# Patient Record
Sex: Female | Born: 1995 | Hispanic: No | Marital: Married | Smoking: Former smoker
Health system: Southern US, Community
[De-identification: ages and names within clinical notes are randomized; demographics above are authoritative.]

## PROBLEM LIST (undated history)

## (undated) DIAGNOSIS — G43909 Migraine, unspecified, not intractable, without status migrainosus: Secondary | ICD-10-CM

## (undated) DIAGNOSIS — J302 Other seasonal allergic rhinitis: Secondary | ICD-10-CM

## (undated) DIAGNOSIS — F419 Anxiety disorder, unspecified: Secondary | ICD-10-CM

## (undated) DIAGNOSIS — E119 Type 2 diabetes mellitus without complications: Secondary | ICD-10-CM

## (undated) DIAGNOSIS — K056 Periodontal disease, unspecified: Secondary | ICD-10-CM

## (undated) HISTORY — DX: Periodontal disease, unspecified: K05.6

## (undated) HISTORY — DX: Migraine, unspecified, not intractable, without status migrainosus: G43.909

## (undated) HISTORY — PX: OTHER SURGICAL HISTORY: SHX169

## (undated) HISTORY — PX: KNEE SURGERY: SHX244

## (undated) HISTORY — DX: Type 2 diabetes mellitus without complications: E11.9

## (undated) HISTORY — DX: Anxiety disorder, unspecified: F41.9

## (undated) HISTORY — PX: WISDOM TOOTH EXTRACTION: SHX21

## (undated) HISTORY — PX: NASAL POLYP EXCISION: SHX2068

## (undated) HISTORY — DX: Other seasonal allergic rhinitis: J30.2

---

## 2020-04-04 LAB — NOVEL CORONAVIRUS, NAA: SARS-CoV-2, NAA: POSITIVE

## 2021-03-18 ENCOUNTER — Encounter: Payer: Self-pay | Admitting: Obstetrics and Gynecology

## 2021-03-27 ENCOUNTER — Ambulatory Visit (INDEPENDENT_AMBULATORY_CARE_PROVIDER_SITE_OTHER): Payer: BC Managed Care – PPO | Admitting: Obstetrics

## 2021-03-27 ENCOUNTER — Encounter: Payer: Self-pay | Admitting: Obstetrics

## 2021-03-27 ENCOUNTER — Other Ambulatory Visit: Payer: Self-pay

## 2021-03-27 VITALS — BP 130/80 | Ht 65.0 in | Wt 130.0 lb

## 2021-03-27 DIAGNOSIS — R350 Frequency of micturition: Secondary | ICD-10-CM | POA: Diagnosis not present

## 2021-03-27 DIAGNOSIS — G43909 Migraine, unspecified, not intractable, without status migrainosus: Secondary | ICD-10-CM | POA: Insufficient documentation

## 2021-03-27 DIAGNOSIS — F419 Anxiety disorder, unspecified: Secondary | ICD-10-CM | POA: Diagnosis not present

## 2021-03-27 DIAGNOSIS — Z113 Encounter for screening for infections with a predominantly sexual mode of transmission: Secondary | ICD-10-CM

## 2021-03-27 DIAGNOSIS — Z23 Encounter for immunization: Secondary | ICD-10-CM

## 2021-03-27 DIAGNOSIS — Z124 Encounter for screening for malignant neoplasm of cervix: Secondary | ICD-10-CM | POA: Diagnosis not present

## 2021-03-27 DIAGNOSIS — G43809 Other migraine, not intractable, without status migrainosus: Secondary | ICD-10-CM
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
## 2021-03-28 ENCOUNTER — Other Ambulatory Visit (HOSPITAL_COMMUNITY)
Admission: RE | Admit: 2021-03-28 | Discharge: 2021-03-28 | Disposition: A | Discharge status: Home / Self Care | Payer: BC Managed Care – PPO | Source: Ambulatory Visit | Attending: Obstetrics and Gynecology | Admitting: Obstetrics and Gynecology

## 2021-03-28 DIAGNOSIS — Z113 Encounter for screening for infections with a predominantly sexual mode of transmission: Secondary | ICD-10-CM | POA: Insufficient documentation

## 2021-03-28 DIAGNOSIS — Z124 Encounter for screening for malignant neoplasm of cervix: Secondary | ICD-10-CM | POA: Insufficient documentation

## 2021-03-28 LAB — HEPATITIS C ANTIBODY: Hep C Virus Ab: 0.2 s/co ratio (ref 0.0–0.9)

## 2021-03-28 LAB — HIV ANTIBODY (ROUTINE TESTING W REFLEX): HIV Screen 4th Generation wRfx: NONREACTIVE

## 2021-03-28 LAB — HEPATITIS B SURFACE ANTIGEN: Hepatitis B Surface Ag: NEGATIVE

## 2021-03-28 LAB — RPR: RPR Ser Ql: NONREACTIVE

## 2021-03-28 NOTE — Addendum Note (Signed)
Addended by: Tommie Raymond on: 03/28/2021 11:27 AM   Modules accepted: Orders

## 2021-03-29 LAB — CERVICOVAGINAL ANCILLARY ONLY
Chlamydia: NEGATIVE
Comment: NEGATIVE
Comment: NORMAL
Neisseria Gonorrhea: NEGATIVE

## 2021-03-29 LAB — CYTOLOGY - PAP: Diagnosis: NEGATIVE

## 2021-03-30 LAB — URINE CULTURE: Organism ID, Bacteria: NO GROWTH

## 2021-04-04 ENCOUNTER — Encounter: Payer: Self-pay | Admitting: Obstetrics

## 2021-05-24 ENCOUNTER — Encounter: Payer: Self-pay | Admitting: Obstetrics

## 2021-05-24 ENCOUNTER — Other Ambulatory Visit (HOSPITAL_COMMUNITY)
Admission: RE | Admit: 2021-05-24 | Discharge: 2021-05-24 | Disposition: A | Discharge status: Home / Self Care | Payer: BC Managed Care – PPO | Source: Ambulatory Visit | Attending: Obstetrics | Admitting: Obstetrics

## 2021-05-24 ENCOUNTER — Other Ambulatory Visit: Payer: Self-pay

## 2021-05-24 ENCOUNTER — Ambulatory Visit (INDEPENDENT_AMBULATORY_CARE_PROVIDER_SITE_OTHER): Payer: BC Managed Care – PPO | Admitting: Obstetrics

## 2021-05-24 VITALS — BP 118/80 | HR 89 | Ht 65.0 in | Wt 124.3 lb

## 2021-05-24 DIAGNOSIS — B3731 Acute candidiasis of vulva and vagina: Secondary | ICD-10-CM | POA: Insufficient documentation

## 2021-05-24 DIAGNOSIS — N898 Other specified noninflammatory disorders of vagina: Secondary | ICD-10-CM | POA: Insufficient documentation

## 2021-05-24 DIAGNOSIS — R350 Frequency of micturition: Secondary | ICD-10-CM | POA: Insufficient documentation

## 2021-05-24 DIAGNOSIS — R631 Polydipsia: Secondary | ICD-10-CM | POA: Insufficient documentation

## 2021-05-24 DIAGNOSIS — N3941 Urge incontinence: Secondary | ICD-10-CM

## 2021-05-24 MED ORDER — FLUCONAZOLE 150 MG PO TABS: 150.0000 mg | ORAL_TABLET | Freq: Once | ORAL | 1 refills | Status: AC

## 2021-05-24 NOTE — Progress Notes (Signed)
SUBJECTIVE  HPI  Stacy George is a 26 y.o.-year-old female who presents for continued vaginal and vulvar itching today. She was seen in December for an annual visit and had vaginal itching and irritation without discharge at that time. Mild vaginal irritation was noted on exam at that time and she was negative for STIs. She has tried removing all potential irritants, using topical Monistat, apple cider vinegar baths, and other means to stop the itching, but it has not improved. She also reports a lump or knot in her vagina that seems to come and go. It is not always noticeable but seems like a cyst that might be draining. She is also concerned about frequent urination. She is waking up nightly and having to run to the bathroom and is sometimes incontinent. She also reports excessive thirst.  Medical/Surgical History Past Medical History:  Diagnosis Date   Anxiety    Migraines    Periodontal disease    Seasonal allergies    Past Surgical History:  Procedure Laterality Date   KNEE SURGERY     cyst and fluid removed from behind knee cap   NASAL POLYP EXCISION     periodontal surgery     WISDOM TOOTH EXTRACTION       OB History     Gravida  0   Para  0   Term  0   Preterm  0   AB  0   Living  0      SAB  0   IAB  0   Ectopic  0   Multiple  0   Live Births  0             Current Medications No outpatient medications prior to visit.   No facility-administered medications prior to visit.     ROS Negative except as noted in HPI  No flowsheet data found.   OBJECTIVE  Last Weight  Most recent update: 05/24/2021  1:59 PM    Weight  56.4 kg (124 lb 4.8 oz)             Body mass index is 20.68 kg/m.    BP 118/80    Pulse 89    Ht 5\' 5"  (1.651 m)    Wt 124 lb 4.8 oz (56.4 kg)    LMP 04/19/2021 (Approximate)    BMI 20.68 kg/m   General appearance: alert, cooperative, and appears stated age Pelvic: White, adherent discharge noted on lower segment  of labia and on clitoral hood. Vulvar skin appears red and irritated.  Unable to palpate or visualize nodule described by Patty. Mild bladder prolapse noted. Dermatologic: Shiny red irritated skin noted in thigh creases  ASSESSMENT  1) Vaginal itching and irritation, likely yeast overgrowth 2) Excessive thirst and urination 3) Urge incontinence, potential mild bladder prolapse  PLAN 1) Swab collected. Treat empirically with Diflucan. Refer to MD for further assessment if no improvement. 2) Hemoglobin A1C 3) Referral to pelvic PT   04/21/2021, CNM

## 2021-05-27 ENCOUNTER — Other Ambulatory Visit: Payer: BC Managed Care – PPO

## 2021-05-28 ENCOUNTER — Other Ambulatory Visit: Payer: Self-pay

## 2021-05-28 ENCOUNTER — Encounter: Payer: Self-pay | Admitting: Obstetrics

## 2021-05-28 ENCOUNTER — Other Ambulatory Visit: Payer: BC Managed Care – PPO

## 2021-05-28 DIAGNOSIS — N3941 Urge incontinence: Secondary | ICD-10-CM

## 2021-05-28 LAB — CERVICOVAGINAL ANCILLARY ONLY
Bacterial Vaginitis (gardnerella): NEGATIVE
Candida Glabrata: NEGATIVE
Candida Vaginitis: POSITIVE — AB
Comment: NEGATIVE
Comment: NEGATIVE
Comment: NEGATIVE
Comment: NEGATIVE
Trichomonas: NEGATIVE

## 2021-05-29 ENCOUNTER — Other Ambulatory Visit: Payer: Self-pay | Admitting: Obstetrics

## 2021-05-29 DIAGNOSIS — R7309 Other abnormal glucose: Secondary | ICD-10-CM

## 2021-05-29 LAB — HEMOGLOBIN A1C
Est. average glucose Bld gHb Est-mCnc: 358 mg/dL
Hgb A1c MFr Bld: 14.1 % — ABNORMAL HIGH (ref 4.8–5.6)

## 2021-05-29 NOTE — Progress Notes (Signed)
Spoke to [place] on phone about lab results. Urgent consult to internal med. Schedule lab-only appointment here for CMP, beta-hyroxybutyrate, and microalbumin in the meantime. Briefly discussed diabetic diet. Shaolin verbalizes understanding and agreement with plan. ? ?M. Chryl Heck, CNM ?

## 2021-06-03 ENCOUNTER — Other Ambulatory Visit: Payer: Self-pay | Admitting: Obstetrics

## 2021-06-03 ENCOUNTER — Encounter: Payer: Self-pay | Admitting: Emergency Medicine

## 2021-06-03 ENCOUNTER — Other Ambulatory Visit: Payer: BC Managed Care – PPO

## 2021-06-03 ENCOUNTER — Emergency Department
Admission: EM | Admit: 2021-06-03 | Discharge: 2021-06-03 | Disposition: A | Discharge status: Home / Self Care | Payer: BC Managed Care – PPO | Source: Home / Self Care | Attending: Emergency Medicine | Admitting: Emergency Medicine

## 2021-06-03 ENCOUNTER — Other Ambulatory Visit: Payer: Self-pay

## 2021-06-03 DIAGNOSIS — K2970 Gastritis, unspecified, without bleeding: Secondary | ICD-10-CM | POA: Insufficient documentation

## 2021-06-03 DIAGNOSIS — N3090 Cystitis, unspecified without hematuria: Secondary | ICD-10-CM | POA: Insufficient documentation

## 2021-06-03 DIAGNOSIS — Z7189 Other specified counseling: Secondary | ICD-10-CM

## 2021-06-03 DIAGNOSIS — D72829 Elevated white blood cell count, unspecified: Secondary | ICD-10-CM | POA: Insufficient documentation

## 2021-06-03 DIAGNOSIS — R739 Hyperglycemia, unspecified: Secondary | ICD-10-CM

## 2021-06-03 DIAGNOSIS — E1165 Type 2 diabetes mellitus with hyperglycemia: Secondary | ICD-10-CM | POA: Insufficient documentation

## 2021-06-03 DIAGNOSIS — R7309 Other abnormal glucose: Secondary | ICD-10-CM

## 2021-06-03 DIAGNOSIS — E1110 Type 2 diabetes mellitus with ketoacidosis without coma: Secondary | ICD-10-CM | POA: Diagnosis not present

## 2021-06-03 DIAGNOSIS — E1010 Type 1 diabetes mellitus with ketoacidosis without coma: Secondary | ICD-10-CM | POA: Diagnosis not present

## 2021-06-03 LAB — CBC
HCT: 40.9 % (ref 36.0–46.0)
Hemoglobin: 13.4 g/dL (ref 12.0–15.0)
MCH: 28.6 pg (ref 26.0–34.0)
MCHC: 32.8 g/dL (ref 30.0–36.0)
MCV: 87.4 fL (ref 80.0–100.0)
Platelets: 336 10*3/uL (ref 150–400)
RBC: 4.68 MIL/uL (ref 3.87–5.11)
RDW: 11.9 % (ref 11.5–15.5)
WBC: 12 10*3/uL — ABNORMAL HIGH (ref 4.0–10.5)
nRBC: 0 % (ref 0.0–0.2)

## 2021-06-03 LAB — BASIC METABOLIC PANEL
Anion gap: 7 (ref 5–15)
BUN: 14 mg/dL (ref 6–20)
CO2: 22 mmol/L (ref 22–32)
Calcium: 8.8 mg/dL — ABNORMAL LOW (ref 8.9–10.3)
Chloride: 103 mmol/L (ref 98–111)
Creatinine, Ser: 0.57 mg/dL (ref 0.44–1.00)
GFR, Estimated: >60 mL/min (ref >60)
Glucose, Bld: 264 mg/dL — ABNORMAL HIGH (ref 70–99)
Potassium: 3.7 mmol/L (ref 3.5–5.1)
Sodium: 132 mmol/L — ABNORMAL LOW (ref 135–145)

## 2021-06-03 LAB — URINALYSIS, COMPLETE (UACMP) WITH MICROSCOPIC
Bilirubin Urine: NEGATIVE
Glucose, UA: >=500 mg/dL — AB
Ketones, ur: 80 mg/dL — AB
Nitrite: NEGATIVE
Protein, ur: 30 mg/dL — AB
Specific Gravity, Urine: 1.038 — ABNORMAL HIGH (ref 1.005–1.030)
pH: 5 (ref 5.0–8.0)

## 2021-06-03 LAB — LIPASE, BLOOD: Lipase: 33 U/L (ref 11–51)

## 2021-06-03 LAB — HEPATIC FUNCTION PANEL
ALT: 11 U/L (ref 0–44)
AST: 13 U/L — ABNORMAL LOW (ref 15–41)
Albumin: 4.4 g/dL (ref 3.5–5.0)
Alkaline Phosphatase: 71 U/L (ref 38–126)
Bilirubin, Direct: <0.1 mg/dL (ref 0.0–0.2)
Total Bilirubin: 0.8 mg/dL (ref 0.3–1.2)
Total Protein: 7.3 g/dL (ref 6.5–8.1)

## 2021-06-03 LAB — POC URINE PREG, ED: Preg Test, Ur: NEGATIVE

## 2021-06-03 MED ORDER — FLUCONAZOLE 50 MG PO TABS
150.0000 mg | ORAL_TABLET | Freq: Once | ORAL | Status: AC
  Administered 2021-06-03: 150 mg via ORAL
  Filled 2021-06-03: qty 1

## 2021-06-03 MED ORDER — PANTOPRAZOLE SODIUM 40 MG PO TBEC: 40.0000 mg | DELAYED_RELEASE_TABLET | Freq: Every day | ORAL | 0 refills | Status: DC

## 2021-06-03 MED ORDER — METFORMIN HCL 500 MG PO TABS: 1000.0000 mg | ORAL_TABLET | Freq: Two times a day (BID) | ORAL | 0 refills | Status: DC

## 2021-06-03 MED ORDER — CEPHALEXIN 500 MG PO CAPS: 500.0000 mg | ORAL_CAPSULE | Freq: Four times a day (QID) | ORAL | 0 refills | Status: DC

## 2021-06-03 NOTE — ED Provider Triage Note (Signed)
Emergency Medicine Provider Triage Evaluation Note ? ?Stacy George , a 26 y.o. female  was evaluated in triage.  Pt complains of presents to the emergency department with dizziness, hyperglycemia and some epigastric abdominal pain.  Patient states that she has not been able to get in with a primary care provider to start diabetes medications despite being diagnosed last week.  She denies chest pain, chest tightness or shortness of breath. ? ?Review of Systems  ?Positive: Patient has hyperglycemia and dizziness. ?Negative: No chest pain. ? ?Physical Exam  ?There were no vitals taken for this visit. ?Gen:   Awake, no distress   ?Resp:  Normal effort  ?MSK:   Moves extremities without difficulty  ?Other:   ? ?Medical Decision Making  ?Medically screening exam initiated at 6:24 PM.  Appropriate orders placed.  Stacy George was informed that the remainder of the evaluation will be completed by another provider, this initial triage assessment does not replace that evaluation, and the importance of remaining in the ED until their evaluation is complete. ? ? ?  ?Stacy [place], PA-C ?06/03/21 1824 ? ?

## 2021-06-03 NOTE — ED Triage Notes (Signed)
Pt reports that last week she was diagnosed with Diabetes last week. She is having trouble finding a doctor to go to. She just moved to the area also. Her A1C was 14.1 and Glucose was 358 last week. When she tested earlier it was 305 she has been unable to get any medications. She has been urinating a lot and very thirsty.   ?

## 2021-06-03 NOTE — ED Provider Notes (Signed)
? ?[facility] ?Provider Note ? ? ? Event Date/Time  ? First MD Initiated Contact with Patient 06/03/21 2120   ?  (approximate) ? ? ?History  ? ?Urinary Frequency and Diabetes ? ? ?HPI ? ?Stacy George is a 26 y.o. female with a past medical history of recently diagnosed diabetes seen on elevated A1c obtained at recent OB visit 2/28 which was initially obtained due to recurrent yeast infections who presents for evaluation of ongoing symptoms which she attributes to this including fatigue, lightheadedness, polyuria, polydipsia some crampy upper abdominal discomfort.  Abdominal discomfort has been going on for 3 to 4 days.  No vomiting, diarrhea, burning with urination although patient does states she has had a little bit of white discharge come back.  No headache, earache, sore throat, vomiting, rash or extremity pain.  No other acute concerns at this time.  No recent EtOH use or illicit drug use other than some intermittent THC.  No other acute concerns at this time. ? ?  ? ? ?Physical Exam  ?Triage Vital Signs: ?ED Triage Vitals [06/03/21 1826]  ?Enc Vitals Group  ?   BP (!) 120/108  ?   Pulse Rate 73  ?   Resp 20  ?   Temp 98.4 ?F (36.9 ?C)  ?   Temp Source Oral  ?   SpO2 100 %  ?   Weight 124 lb (56.2 kg)  ?   Height 5\' 5"  (1.651 m)  ?   Head Circumference   ?   Peak Flow   ?   Pain Score 0  ?   Pain Loc   ?   Pain Edu?   ?   Excl. in GC?   ? ? ?Most recent vital signs: ?Vitals:  ? 06/03/21 1826 06/03/21 2130  ?BP: (!) 120/108 123/84  ?Pulse: 73 74  ?Resp: 20 19  ?Temp: 98.4 ?F (36.9 ?C) 98.4 ?F (36.9 ?C)  ?SpO2: 100% 100%  ? ? ?General: Awake, no distress.  ?CV:  Good peripheral perfusion.  ?Resp:  Normal effort.  ?Abd:  No distention.  Soft throughout although there is some very mild tenderness in left upper quadrant. ?Other:  No CVA tenderness. ? ? ?ED Results / Procedures / Treatments  ?Labs ?(all labs ordered are listed, but only abnormal results are displayed) ?Labs Reviewed  ?BASIC  METABOLIC PANEL - Abnormal; Notable for the following components:  ?    Result Value  ? Sodium 132 (*)   ? Glucose, Bld 264 (*)   ? Calcium 8.8 (*)   ? All other components within normal limits  ?CBC - Abnormal; Notable for the following components:  ? WBC 12.0 (*)   ? All other components within normal limits  ?HEPATIC FUNCTION PANEL - Abnormal; Notable for the following components:  ? AST 13 (*)   ? All other components within normal limits  ?URINALYSIS, COMPLETE (UACMP) WITH MICROSCOPIC - Abnormal; Notable for the following components:  ? Color, Urine YELLOW (*)   ? APPearance HAZY (*)   ? Specific Gravity, Urine 1.038 (*)   ? Glucose, UA >=500 (*)   ? Hgb urine dipstick MODERATE (*)   ? Ketones, ur 80 (*)   ? Protein, ur 30 (*)   ? Leukocytes,Ua SMALL (*)   ? Bacteria, UA RARE (*)   ? All other components within normal limits  ?URINE CULTURE  ?LIPASE, BLOOD  ?POC URINE PREG, ED  ?CBG MONITORING, ED  ?CBG MONITORING, ED  ? ? ? ?  EKG ? ? ? ?RADIOLOGY ? ? ? ?PROCEDURES: ? ?Critical Care performed: No ? ?Procedures ? ? ? ?MEDICATIONS ORDERED IN ED: ?Medications  ?fluconazole (DIFLUCAN) tablet 150 mg (150 mg Oral Given 06/03/21 2144)  ? ? ? ?IMPRESSION / MDM / ASSESSMENT AND PLAN / ED COURSE  ?I reviewed the triage vital signs and the nursing notes. ?             ?               ? ?Patient presents with above-stated history and exam for evaluation of several weeks of progressively worsening fatigue, headedness, and polyuria and polydipsia as well as a couple days of some crampy intermittent left upper quadrant abdominal pain.  She was recently informed she is a diabetic.  She is not currently on medications for this and does not follow-up with PCP.  On arrival she is afebrile and hemodynamically stable. ? ?I suspect her left upper quadrant discomfort is likely secondary some gastritis.  She is denying any abdominal pain is no other tenderness or abnormal vitals to suggest a kidney stone, torsion, appendicitis,  diverticulitis or other immediate life-threatening intra-abdominal process.  No epigastric tenderness or elevation of the lipase to suggest acute pancreatitis.  BMP today confirms hyperglycemia with a glucose of 264 without any other significant electrolyte or metabolic derangements.  No acidosis to suggest DKA.  Patient is not in HHS.  CBC with nonspecific leukocytosis with WBC count of 12,000 without evidence of acute anemia and normal platelets. ? ?Pregnancy test is negative.  Lipase not suggestive of acute pancreatitis.  Hepatic function panel is unremarkable.  Urinalysis is some ketones and glucose as well as moderate hemoglobin and small leukocyte esterase with 11-20 WBCs and some bacteria.  Somewhat difficult to exclude cystitis.  Will cover with course of Keflex and send urine culture. ? ?We will give a one-time dose of fluconazole the patient reports recurrence of her white vaginal discharge that is very itchy and recent diagnosis of vaginal candidiasis.  We will have her follow-up with primary care.  Will start patient on metformin.  At this point do not believe she requires hospitalization and is stable for discharge with outpatient follow-up.  Extensive discussion had regarding pathophysiology of diabetes and importance of taking medications and close follow-up for further management and avoid complications.  Discussed returning for any new or worsening of symptoms.  Discharged in stable condition.  Strict return precautions advised and discussed. ? ?  ? ? ?FINAL CLINICAL IMPRESSION(S) / ED DIAGNOSES  ? ?Final diagnoses:  ?Hyperglycemia  ?Diabetes education, encounter for  ?Gastritis without bleeding, unspecified chronicity, unspecified gastritis type  ?Cystitis  ? ? ? ?Rx / DC Orders  ? ?ED Discharge Orders   ? ?      Ordered  ?  metFORMIN (GLUCOPHAGE) 500 MG tablet  2 times daily with meals       ? 06/03/21 2141  ?  pantoprazole (PROTONIX) 40 MG tablet  Daily       ? 06/03/21 2146  ?  cephALEXin  (KEFLEX) 500 MG capsule  4 times daily       ? 06/03/21 2200  ? ?  ?  ? ?  ? ? ? ?Note:  This document was prepared using Dragon voice recognition software and may include unintentional dictation errors. ?  ?Gilles Chiquito, MD ?06/03/21 2201 ? ?

## 2021-06-03 NOTE — Addendum Note (Signed)
Addended by: Tommie Raymond on: 06/03/2021 03:05 PM ? ? Modules accepted: Orders ? ?

## 2021-06-04 LAB — MICROALBUMIN / CREATININE URINE RATIO
Creatinine, Urine: 53.8 mg/dL
Microalb/Creat Ratio: 25 mg/g creat (ref 0–29)
Microalbumin, Urine: 13.7 ug/mL

## 2021-06-04 LAB — COMPREHENSIVE METABOLIC PANEL
ALT: 8 IU/L (ref 0–32)
AST: 11 IU/L (ref 0–40)
Albumin/Globulin Ratio: 2 (ref 1.2–2.2)
Albumin: 4.7 g/dL (ref 3.9–5.0)
Alkaline Phosphatase: 94 IU/L (ref 44–121)
BUN/Creatinine Ratio: 22 (ref 9–23)
BUN: 12 mg/dL (ref 6–20)
Bilirubin Total: 0.4 mg/dL (ref 0.0–1.2)
CO2: 18 mmol/L — ABNORMAL LOW (ref 20–29)
Calcium: 9.6 mg/dL (ref 8.7–10.2)
Chloride: 101 mmol/L (ref 96–106)
Creatinine, Ser: 0.55 mg/dL — ABNORMAL LOW (ref 0.57–1.00)
Globulin, Total: 2.3 g/dL (ref 1.5–4.5)
Glucose: 272 mg/dL — ABNORMAL HIGH (ref 70–99)
Potassium: 4 mmol/L (ref 3.5–5.2)
Sodium: 136 mmol/L (ref 134–144)
Total Protein: 7 g/dL (ref 6.0–8.5)
eGFR: 132 mL/min/{1.73_m2} (ref >59)

## 2021-06-05 LAB — URINE CULTURE

## 2021-06-06 ENCOUNTER — Other Ambulatory Visit: Payer: Self-pay

## 2021-06-06 ENCOUNTER — Inpatient Hospital Stay: Payer: BC Managed Care – PPO

## 2021-06-06 ENCOUNTER — Inpatient Hospital Stay
Admission: EM | Admit: 2021-06-06 | Discharge: 2021-06-07 | DRG: 638 | Disposition: A | Discharge status: Home / Self Care | Payer: BC Managed Care – PPO | Attending: Internal Medicine | Admitting: Internal Medicine

## 2021-06-06 ENCOUNTER — Encounter: Payer: Self-pay | Admitting: Emergency Medicine

## 2021-06-06 DIAGNOSIS — E872 Acidosis, unspecified: Secondary | ICD-10-CM | POA: Diagnosis present

## 2021-06-06 DIAGNOSIS — E1110 Type 2 diabetes mellitus with ketoacidosis without coma: Secondary | ICD-10-CM | POA: Diagnosis present

## 2021-06-06 DIAGNOSIS — E871 Hypo-osmolality and hyponatremia: Secondary | ICD-10-CM | POA: Diagnosis present

## 2021-06-06 DIAGNOSIS — Z20822 Contact with and (suspected) exposure to covid-19: Secondary | ICD-10-CM | POA: Diagnosis present

## 2021-06-06 DIAGNOSIS — Z7984 Long term (current) use of oral hypoglycemic drugs: Secondary | ICD-10-CM | POA: Diagnosis not present

## 2021-06-06 DIAGNOSIS — E109 Type 1 diabetes mellitus without complications: Secondary | ICD-10-CM | POA: Diagnosis present

## 2021-06-06 DIAGNOSIS — R112 Nausea with vomiting, unspecified: Secondary | ICD-10-CM | POA: Diagnosis not present

## 2021-06-06 DIAGNOSIS — Z79899 Other long term (current) drug therapy: Secondary | ICD-10-CM | POA: Diagnosis not present

## 2021-06-06 DIAGNOSIS — J302 Other seasonal allergic rhinitis: Secondary | ICD-10-CM | POA: Diagnosis present

## 2021-06-06 DIAGNOSIS — K2970 Gastritis, unspecified, without bleeding: Secondary | ICD-10-CM | POA: Diagnosis present

## 2021-06-06 DIAGNOSIS — E1010 Type 1 diabetes mellitus with ketoacidosis without coma: Principal | ICD-10-CM | POA: Diagnosis present

## 2021-06-06 DIAGNOSIS — F17290 Nicotine dependence, other tobacco product, uncomplicated: Secondary | ICD-10-CM | POA: Diagnosis present

## 2021-06-06 DIAGNOSIS — E0800 Diabetes mellitus due to underlying condition with hyperosmolarity without nonketotic hyperglycemic-hyperosmolar coma (NKHHC): Secondary | ICD-10-CM

## 2021-06-06 LAB — CBG MONITORING, ED
Glucose-Capillary: 349 mg/dL — ABNORMAL HIGH (ref 70–99)
Glucose-Capillary: 304 mg/dL — ABNORMAL HIGH (ref 70–99)
Glucose-Capillary: 256 mg/dL — ABNORMAL HIGH (ref 70–99)
Glucose-Capillary: 182 mg/dL — ABNORMAL HIGH (ref 70–99)
Glucose-Capillary: 128 mg/dL — ABNORMAL HIGH (ref 70–99)

## 2021-06-06 LAB — COMPREHENSIVE METABOLIC PANEL
ALT: 10 U/L (ref 0–44)
AST: 14 U/L — ABNORMAL LOW (ref 15–41)
Albumin: 4.4 g/dL (ref 3.5–5.0)
Alkaline Phosphatase: 75 U/L (ref 38–126)
Anion gap: 11 (ref 5–15)
BUN: 10 mg/dL (ref 6–20)
CO2: 21 mmol/L — ABNORMAL LOW (ref 22–32)
Calcium: 9.5 mg/dL (ref 8.9–10.3)
Chloride: 99 mmol/L (ref 98–111)
Creatinine, Ser: 0.52 mg/dL (ref 0.44–1.00)
GFR, Estimated: >60 mL/min (ref >60)
Glucose, Bld: 379 mg/dL — ABNORMAL HIGH (ref 70–99)
Potassium: 3.6 mmol/L (ref 3.5–5.1)
Sodium: 131 mmol/L — ABNORMAL LOW (ref 135–145)
Total Bilirubin: 1.1 mg/dL (ref 0.3–1.2)
Total Protein: 7.4 g/dL (ref 6.5–8.1)

## 2021-06-06 LAB — CBC
HCT: 48.9 % — ABNORMAL HIGH (ref 36.0–46.0)
Hemoglobin: 14.4 g/dL (ref 12.0–15.0)
MCH: 28.8 pg (ref 26.0–34.0)
MCHC: 29.4 g/dL — ABNORMAL LOW (ref 30.0–36.0)
MCV: 97.8 fL (ref 80.0–100.0)
Platelets: 372 10*3/uL (ref 150–400)
RBC: 5 MIL/uL (ref 3.87–5.11)
RDW: 11.5 % (ref 11.5–15.5)
WBC: 10.4 10*3/uL (ref 4.0–10.5)
nRBC: 0 % (ref 0.0–0.2)

## 2021-06-06 LAB — URINALYSIS, ROUTINE W REFLEX MICROSCOPIC
Bacteria, UA: NONE SEEN
Bilirubin Urine: NEGATIVE
Glucose, UA: >=500 mg/dL — AB
Hgb urine dipstick: NEGATIVE
Ketones, ur: 80 mg/dL — AB
Leukocytes,Ua: NEGATIVE
Nitrite: NEGATIVE
Protein, ur: NEGATIVE mg/dL
Specific Gravity, Urine: 1.039 — ABNORMAL HIGH (ref 1.005–1.030)
pH: 6 (ref 5.0–8.0)

## 2021-06-06 LAB — RESP PANEL BY RT-PCR (FLU A&B, COVID) ARPGX2
Influenza A by PCR: NEGATIVE
Influenza B by PCR: NEGATIVE
SARS Coronavirus 2 by RT PCR: NEGATIVE

## 2021-06-06 LAB — BETA-HYDROXYBUTYRIC ACID: Beta-Hydroxybutyric Acid: 1.35 mmol/L — ABNORMAL HIGH (ref 0.05–0.27)

## 2021-06-06 LAB — POC URINE PREG, ED: Preg Test, Ur: NEGATIVE

## 2021-06-06 LAB — LIPASE, BLOOD: Lipase: 33 U/L (ref 11–51)

## 2021-06-06 MED ORDER — ENOXAPARIN SODIUM 40 MG/0.4ML IJ SOSY
40.0000 mg | PREFILLED_SYRINGE | INTRAMUSCULAR | Status: DC
  Administered 2021-06-06: 22:00:00 40 mg via SUBCUTANEOUS
  Filled 2021-06-06: qty 0.4

## 2021-06-06 MED ORDER — SODIUM CHLORIDE 0.9 % IV SOLN
1.0000 g | INTRAVENOUS | Status: DC
  Administered 2021-06-06: 22:00:00 1 g via INTRAVENOUS
  Filled 2021-06-06 (×2): qty 10

## 2021-06-06 MED ORDER — LACTATED RINGERS IV BOLUS
1000.0000 mL | Freq: Once | INTRAVENOUS | Status: AC
  Administered 2021-06-06: 21:00:00 1000 mL via INTRAVENOUS

## 2021-06-06 MED ORDER — DEXTROSE 50 % IV SOLN: 0.0000 mL | INTRAVENOUS | Status: DC | PRN

## 2021-06-06 MED ORDER — LACTATED RINGERS IV SOLN: INTRAVENOUS | Status: DC

## 2021-06-06 MED ORDER — ONDANSETRON HCL 4 MG/2ML IJ SOLN
4.0000 mg | Freq: Once | INTRAMUSCULAR | Status: AC
  Administered 2021-06-06: 19:00:00 4 mg via INTRAVENOUS
  Filled 2021-06-06: qty 2

## 2021-06-06 MED ORDER — DEXTROSE IN LACTATED RINGERS 5 % IV SOLN: INTRAVENOUS | Status: DC

## 2021-06-06 MED ORDER — INSULIN REGULAR(HUMAN) IN NACL 100-0.9 UT/100ML-% IV SOLN
INTRAVENOUS | Status: DC
  Administered 2021-06-06: 21:00:00 3.8 [IU]/h via INTRAVENOUS
  Administered 2021-06-06: 23:00:00 0.3 [IU]/h via INTRAVENOUS
  Administered 2021-06-07: 0.5 [IU]/h via INTRAVENOUS
  Administered 2021-06-07: 0.4 [IU]/h via INTRAVENOUS
  Filled 2021-06-06: qty 100

## 2021-06-06 MED ORDER — LACTATED RINGERS IV BOLUS
1000.0000 mL | Freq: Once | INTRAVENOUS | Status: AC
  Administered 2021-06-06: 19:00:00 1000 mL via INTRAVENOUS

## 2021-06-06 NOTE — H&P (Signed)
History and Physical   George Stacy AUQ:[id number] DOB: 08-30-1995 DOA: 06/06/2021  PCP: Pcp, No  Patient coming from: home  I have personally briefly reviewed patient's old medical records in [facility] EMR.  Chief Concern: Hyperglycemia  HPI: [name] is a 77 with prior diagnosis of diabetes and history of polyuria and polydipsia, presents emergency department from home for chief concerns of hyperglycemia.  Initial vitals in the emergency department showed temperature of 98.1, respiration rate of 18, heart rate 78, blood pressure 122/85, SPO2 of 96% on room air.  Serum sodium was 131, potassium 3.6, chloride 99, bicarb 21, BUN of 10, serum creatinine of 0.52, GFR greater than 60, nonfasting blood glucose 379, WBC 10.4, hemoglobin 14.4, platelets of 372.  Beta hydroxybutyrate was elevated at 1.35.  UA was negative for nitrates leukocytes.  Pregnancy was negative.  Of note her UA on 06/04/2020 showed small leukocytes.  ED treatment: LR 1 L bolus, ondansetron 4 mg.  At bedside, she is able to tell me her name, age, identifiy her finace at bedside.   She woke up vomiting. She check her glucose with otc glucometer and it was 307 and she had saltine crackers and she could not keep anything down, on repeat it was 477 and third 482.   She denies dysuria, hematuria, diarrhea, syncope.   She endorses chest pain, that happens at night. She feels it is difficult to breath at night.  She denies fever, cough. She endorses chills.   Social history: She lives with her fiance. She vapes infrequently. She infrequently drinks etoh and last drink was three weeks. She infrequently smokes thc. She works as a Education officer, environmental at [employer].   Vaccination history: She is vaccinated for covid 19 and influenza.   ROS: Constitutional: no weight change, no fever ENT/Mouth: no sore throat, no rhinorrhea Eyes: no eye pain, no vision changes Cardiovascular: no chest pain, no dyspnea,  no  edema, no palpitations Respiratory: no cough, no sputum, no wheezing Gastrointestinal: no nausea, no vomiting, no diarrhea, no constipation Genitourinary: no urinary incontinence, no dysuria, no hematuria Musculoskeletal: no arthralgias, no myalgias Skin: no skin lesions, no pruritus, Neuro: + weakness, no loss of consciousness, no syncope Psych: no anxiety, no depression, + decrease appetite Heme/Lymph: no bruising, no bleeding  ED Course: Discussed with emergency medicine provider, patient requiring hospitalization for chief concerns of hyperglycemia requiring insulin drip.  Assessment/Plan  Principal Problem:   Intractable nausea and vomiting Active Problems:   Hyperglycemia   Diabetes mellitus (HCC)   Metabolic acidosis   Hyperosmolar hyponatremia    Endocrine Diabetes mellitus (HCC) Assessment & Plan - At this time unclear if it is type I, 1.5, type II - Extensive discussion with patient that she will need close follow-up with PCP - With A1c of 14.1, patient is not a candidate for oral antihyperglycemic medications - Patient will need insulin, glucometer, lancets, glucose strips - Discussed extensively patient that she may be a candidate for continuous glucose monitoring and or insulin pump with good compliance - Advised patient to check her feet and toes daily and to have annual eye exams - She endorses understanding and compliance  - TOC consulted for PCP needs; diabetes coordinator education ordered  Other Hyperosmolar hyponatremia Assessment & Plan - Corrected is 138, [place] 1999  Metabolic acidosis Assessment & Plan - Mild with bicarb of 21 and elevated beta hydroxybutyrate - Her gap is not elevated - Continue insulin GTT as above  Hyperglycemia Assessment & Plan -  Patient clinical presentation is consistent with diabetes type 1 versus diabetes type 1.5 - Her A1c on 05/28/2021 is 14.1 - Patient is not a candidate for oral antiglycemic agents alone given the  A1c above - Patient will need endocrinology referral and/or close follow-up with PCP in the management of diabetes - Patient will need insulin at this time and dosing will need to be adjusted - Admit to stepdown, inpatient, telemetry for insulin GTT  Patient just moved here from [place] and will need PCP referral  Chart reviewed.   DVT prophylaxis: Enoxaparin Code Status: Full code Diet: Carb modified Family Communication: Updated fianc, Greig CastillaAndrew, at bedside Disposition Plan: Pending clinical course Consults called: TOC, diabetes coordinator Admission status: Stepdown, inpatient  Past Medical History:  Diagnosis Date   Anxiety    Migraines    Periodontal disease    Seasonal allergies    Past Surgical History:  Procedure Laterality Date   KNEE SURGERY     cyst and fluid removed from behind knee cap   NASAL POLYP EXCISION     periodontal surgery     WISDOM TOOTH EXTRACTION     Social History:  reports that she has never smoked. She has never used smokeless tobacco. She reports current alcohol use of about 1.0 standard drink per week. She reports current drug use. Drug: Marijuana.  No Known Allergies Family History  Problem Relation Age of Onset   Hypertension Maternal Grandmother    Breast cancer Paternal Grandmother        not sure of age   Family history: Family history reviewed and not pertinent  Prior to Admission medications   Medication Sig Start Date End Date Taking? Authorizing Provider  cephALEXin (KEFLEX) 500 MG capsule Take 1 capsule (500 mg total) by mouth 4 (four) times daily for 5 days. 06/03/21 06/08/21 Yes Gilles ChiquitoSmith, Zachary P, MD  metFORMIN (GLUCOPHAGE) 500 MG tablet Take 2 tablets (1,000 mg total) by mouth 2 (two) times daily with a meal. 06/03/21 07/03/21 Yes Gilles ChiquitoSmith, Zachary P, MD  pantoprazole (PROTONIX) 40 MG tablet Take 1 tablet (40 mg total) by mouth daily. 06/03/21 07/03/21 Yes Gilles ChiquitoSmith, Zachary P, MD   Physical Exam: Vitals:   06/06/21 1631 06/06/21 1635  06/06/21 2231  BP:  122/85 121/87  Pulse:  78 80  Resp:  18 (!) 27  Temp:  98.1 F (36.7 C)   TempSrc:  Oral   SpO2:  96% 98%  Weight: 56.2 kg    Height: 5\' 5"  (1.651 m)     Constitutional: appears age-appropriate, NAD, calm, comfortable Eyes: PERRL, lids and conjunctivae normal ENMT: Mucous membranes are moist. Posterior pharynx clear of any exudate or lesions. Age-appropriate dentition. Hearing appropriate Neck: normal, supple, no masses, no thyromegaly Respiratory: clear to auscultation bilaterally, no wheezing, no crackles. Normal respiratory effort. No accessory muscle use.  Cardiovascular: Regular rate and rhythm, no murmurs / rubs / gallops. No extremity edema. 2+ pedal pulses. No carotid bruits.  Abdomen: no tenderness, no masses palpated, no hepatosplenomegaly. Bowel sounds positive.  Musculoskeletal: no clubbing / cyanosis. No joint deformity upper and lower extremities. Good ROM, no contractures, no atrophy. Normal muscle tone.  Skin: no rashes, lesions, ulcers. No induration Neurologic: Sensation intact. Strength 5/5 in all 4.  Psychiatric: Normal judgment and insight. Alert and oriented x 3. Normal mood.   EKG: Not indicated  Chest x-ray on Admission: I personally reviewed and I agree with radiologist reading as below.  Portable chest x-ray (1 view)  Result Date:  06/06/2021 CLINICAL DATA:  Nausea EXAM: PORTABLE CHEST 1 VIEW COMPARISON:  None. FINDINGS: Cardiac and mediastinal contours are within normal limits. No focal pulmonary opacity. No pleural effusion or pneumothorax. No acute osseous abnormality. IMPRESSION: No acute cardiopulmonary process. Electronically Signed   By: Wiliam Ke M.D.   On: 06/06/2021 19:55    Labs on Admission: I have personally reviewed following labs  CBC: Recent Labs  Lab 06/03/21 1833 06/06/21 1637  WBC 12.0* 10.4  HGB 13.4 14.4  HCT 40.9 48.9*  MCV 87.4 97.8  PLT 336 372   Basic Metabolic Panel: Recent Labs  Lab 06/03/21 1518  06/03/21 1833 06/06/21 1637  NA 136 132* 131*  K 4.0 3.7 3.6  CL 101 103 99  CO2 18* 22 21*  GLUCOSE 272* 264* 379*  BUN 12 14 10   CREATININE 0.55* 0.57 0.52  CALCIUM 9.6 8.8* 9.5   GFR: Estimated Creatinine Clearance: 97 mL/min (by C-G formula based on SCr of 0.52 mg/dL).  Liver Function Tests: Recent Labs  Lab 06/03/21 1518 06/03/21 1833 06/06/21 1637  AST 11 13* 14*  ALT 8 11 10   ALKPHOS 94 71 75  BILITOT 0.4 0.8 1.1  PROT 7.0 7.3 7.4  ALBUMIN 4.7 4.4 4.4   Recent Labs  Lab 06/03/21 1833 06/06/21 1637  LIPASE 33 33   CBG: Recent Labs  Lab 06/06/21 1634 06/06/21 1926 06/06/21 2041 06/06/21 2309  GLUCAP 349* 304* 256* 128*   Urine analysis:    Component Value Date/Time   COLORURINE STRAW (A) 06/06/2021 1637   APPEARANCEUR CLEAR (A) 06/06/2021 1637   LABSPEC 1.039 (H) 06/06/2021 1637   PHURINE 6.0 06/06/2021 1637   GLUCOSEU >=500 (A) 06/06/2021 1637   HGBUR NEGATIVE 06/06/2021 1637   BILIRUBINUR NEGATIVE 06/06/2021 1637   KETONESUR 80 (A) 06/06/2021 1637   PROTEINUR NEGATIVE 06/06/2021 1637   NITRITE NEGATIVE 06/06/2021 1637   LEUKOCYTESUR NEGATIVE 06/06/2021 1637   Dr. 08/06/2021 [facility]  If 7PM-7AM, please contact overnight-coverage provider If 7AM-7PM, please contact day coverage provider [email or web address]  06/06/2021, 11:14 PM

## 2021-06-06 NOTE — ED Triage Notes (Signed)
Pt comes into the ED via POv c/o nausea.  Pt seen on 06/03/21 for the same thing and explains she was recently diagnosed with diabetes.  Pt in NAD at this time with even and unlabored respirations.  ?

## 2021-06-06 NOTE — ED Provider Notes (Signed)
? ?[facility] ?Provider Note ? ? ? Event Date/Time  ? First MD Initiated Contact with Patient 06/06/21 1828   ?  (approximate) ? ? ?History  ? ?Nausea ? ? ?HPI ? ?Stacy George is a 26 y.o. female here with nausea and vomiting.  The patient states that she was just recently diagnosed with diabetes at her OB after she had a positive A1c for recurrent yeast infections.  She states that she has had progressively worsening general fatigue, polyuria, polydipsia, and malaise.  She has been nauseous.  She has been losing weight.  She was prescribed metformin, states she has been taking this without significant relief.  She has, however, had increased loose stools.  Denies any overt fevers or chills.  No other complaints.  No known family history of diabetes. ?  ? ? ?Physical Exam  ? ?Triage Vital Signs: ?ED Triage Vitals  ?Enc Vitals Group  ?   BP 06/06/21 1635 122/85  ?   Pulse Rate 06/06/21 1635 78  ?   Resp 06/06/21 1635 18  ?   Temp 06/06/21 1635 98.1 ?F (36.7 ?C)  ?   Temp Source 06/06/21 1635 Oral  ?   SpO2 06/06/21 1635 96 %  ?   Weight 06/06/21 1631 124 lb (56.2 kg)  ?   Height 06/06/21 1631 5\' 5"  (1.651 m)  ?   Head Circumference --   ?   Peak Flow --   ?   Pain Score 06/06/21 1631 0  ?   Pain Loc --   ?   Pain Edu? --   ?   Excl. in GC? --   ? ? ?Most recent vital signs: ?Vitals:  ? 06/06/21 1635  ?BP: 122/85  ?Pulse: 78  ?Resp: 18  ?Temp: 98.1 ?F (36.7 ?C)  ?SpO2: 96%  ? ? ? ?General: Awake, no distress.  ?CV:  Good peripheral perfusion.  Regular rate and rhythm. ?Resp:  Normal effort.  Slight tachypnea.  Breath sounds clear. ?Abd:  No distention.  No tenderness. ?Other:  Dry mucous membranes. ? ? ?ED Results / Procedures / Treatments  ? ?Labs ?(all labs ordered are listed, but only abnormal results are displayed) ?Labs Reviewed  ?COMPREHENSIVE METABOLIC PANEL - Abnormal; Notable for the following components:  ?    Result Value  ? Sodium 131 (*)   ? CO2 21 (*)   ? Glucose, Bld 379 (*)    ? AST 14 (*)   ? All other components within normal limits  ?CBC - Abnormal; Notable for the following components:  ? HCT 48.9 (*)   ? MCHC 29.4 (*)   ? All other components within normal limits  ?URINALYSIS, ROUTINE W REFLEX MICROSCOPIC - Abnormal; Notable for the following components:  ? Color, Urine STRAW (*)   ? APPearance CLEAR (*)   ? Specific Gravity, Urine 1.039 (*)   ? Glucose, UA >=500 (*)   ? Ketones, ur 80 (*)   ? All other components within normal limits  ?BETA-HYDROXYBUTYRIC ACID - Abnormal; Notable for the following components:  ? Beta-Hydroxybutyric Acid 1.35 (*)   ? All other components within normal limits  ?CBG MONITORING, ED - Abnormal; Notable for the following components:  ? Glucose-Capillary 349 (*)   ? All other components within normal limits  ?CBG MONITORING, ED - Abnormal; Notable for the following components:  ? Glucose-Capillary 304 (*)   ? All other components within normal limits  ?CBG MONITORING, ED - Abnormal; Notable for  the following components:  ? Glucose-Capillary 256 (*)   ? All other components within normal limits  ?RESP PANEL BY RT-PCR (FLU A&B, COVID) ARPGX2  ?LIPASE, BLOOD  ?BASIC METABOLIC PANEL  ?POC URINE PREG, ED  ? ? ? ?EKG ? ? ? ?RADIOLOGY ? ? ? ? ?PROCEDURES: ? ?Critical Care performed: No ? ?.1-3 Lead EKG Interpretation ?Performed by: Shaune PollackIsaacs, Evelena Masci, MD ?Authorized by: Shaune PollackIsaacs, Lafawn Lenoir, MD  ? ?  Interpretation: normal   ?  ECG rate:  70-90 ?  ECG rate assessment: normal   ?  Rhythm: sinus rhythm   ?  Ectopy: none   ?  Conduction: normal   ?Comments:  ?   Indication: weakness ?.Critical Care ?Performed by: Shaune PollackIsaacs, Lilianne Delair, MD ?Authorized by: Shaune PollackIsaacs, Vonnetta Akey, MD  ? ?Critical care provider statement:  ?  Critical care time (minutes):  30 ?  Critical care was necessary to treat or prevent imminent or life-threatening deterioration of the following conditions:  Circulatory failure, cardiac failure and respiratory failure ?  Critical care was time spent personally by me  on the following activities:  Development of treatment plan with patient or surrogate, discussions with consultants, evaluation of patient's response to treatment, examination of patient, ordering and review of laboratory studies, ordering and review of radiographic studies, ordering and performing treatments and interventions, pulse oximetry, re-evaluation of patient's condition and review of old charts ? ? ? ?MEDICATIONS ORDERED IN ED: ?Medications  ?enoxaparin (LOVENOX) injection 40 mg (has no administration in time range)  ?insulin regular, human (MYXREDLIN) 100 units/ 100 mL infusion (1.5 Units/hr Intravenous Rate/Dose Change 06/06/21 2159)  ?lactated ringers infusion ( Intravenous New Bag/Given 06/06/21 2051)  ?dextrose 5 % in lactated ringers infusion (0 mLs Intravenous Hold 06/06/21 2016)  ?dextrose 50 % solution 0-50 mL (has no administration in time range)  ?cefTRIAXone (ROCEPHIN) 1 g in sodium chloride 0.9 % 100 mL IVPB (has no administration in time range)  ?lactated ringers bolus 1,000 mL (1,000 mLs Intravenous New Bag/Given 06/06/21 2038)  ?ondansetron [facility](ZOFRAN) injection 4 mg (4 mg Intravenous Given 06/06/21 1917)  ?lactated ringers bolus 1,000 mL (0 mLs Intravenous Stopped 06/06/21 2039)  ? ? ? ?IMPRESSION / MDM / ASSESSMENT AND PLAN / ED COURSE  ?I reviewed the triage vital signs and the nursing notes. ?             ?               ? ? ?The patient is on the cardiac monitor to evaluate for evidence of arrhythmia and/or significant heart rate changes. ? ? ? ?MDM:  ?26 year old female with recently diagnosed diabetes here with ongoing polyuria, polydipsia, and fatigue.  Patient interestingly is only 50 kg, has had significant weight loss, is 26 years old, and has signs and symptoms that would suggest type I as opposed to type II, or possibly type 1.5 diabetes.  She was recently seen and had significant ketonuria, which she continues to have today.  Although the anion gap is normal, bicarb 21, and beta  hydroxybutyric acid 1.35.  Suspect very mild DKA, possibly in the setting of late onset type 1 diabetes vs T1.5/2.  Given her recurrent symptoms, signs of DKA, will admit for IV insulin and fluids. No apparent infectious triggers.  ? ?Pt improving with IVF. Will place on IV insulin gtt for suspected early DKA, admit to medicine. ? ?MEDICATIONS GIVEN IN ED: ?Medications  ?enoxaparin (LOVENOX) injection 40 mg (has no administration in time range)  ?insulin  regular, human (MYXREDLIN) 100 units/ 100 mL infusion (1.5 Units/hr Intravenous Rate/Dose Change 06/06/21 2159)  ?lactated ringers infusion ( Intravenous New Bag/Given 06/06/21 2051)  ?dextrose 5 % in lactated ringers infusion (0 mLs Intravenous Hold 06/06/21 2016)  ?dextrose 50 % solution 0-50 mL (has no administration in time range)  ?cefTRIAXone (ROCEPHIN) 1 g in sodium chloride 0.9 % 100 mL IVPB (has no administration in time range)  ?lactated ringers bolus 1,000 mL (1,000 mLs Intravenous New Bag/Given 06/06/21 2038)  ?ondansetron [facility]) injection 4 mg (4 mg Intravenous Given 06/06/21 1917)  ?lactated ringers bolus 1,000 mL (0 mLs Intravenous Stopped 06/06/21 2039)  ? ? ? ?Consults:  ?Hospitalist for admission ? ? ?EMR reviewed  ?Prior ED visit ? ? ?FINAL CLINICAL IMPRESSION(S) / ED DIAGNOSES  ? ?Final diagnoses:  ?DKA (diabetic ketoacidosis) (HCC)  ? ? ? ?Rx / DC Orders  ? ?ED Discharge Orders   ? ? None  ? ?  ? ? ? ?Note:  This document was prepared using Dragon voice recognition software and may include unintentional dictation errors. ?  ?Shaune Pollack, MD ?06/06/21 2221 ? ?

## 2021-06-06 NOTE — Hospital Course (Addendum)
[name] is a 22 with prior diagnosis of diabetes and history of polyuria and polydipsia, presents emergency department from home for chief concerns of hyperglycemia. ? ?Initial vitals in the emergency department showed temperature of 98.1, respiration rate of 18, heart rate 78, blood pressure 122/85, SPO2 of 96% on room air. ? ?Serum sodium was 131, potassium 3.6, chloride 99, bicarb 21, BUN of 10, serum creatinine of 0.52, GFR greater than 60, nonfasting blood glucose 379, WBC 10.4, hemoglobin 14.4, platelets of 372. ? ?Beta hydroxybutyrate was elevated at 1.35. ? ?UA was negative for nitrates leukocytes.  Pregnancy was negative. ? ?Of note her UA on 06/04/2020 showed small leukocytes. ? ?ED treatment: LR 1 L bolus, ondansetron 4 mg. ?

## 2021-06-06 NOTE — Assessment & Plan Note (Signed)
-   Mild with bicarb of 21 and elevated beta hydroxybutyrate ?- Her gap is not elevated ?- Continue insulin GTT as above ?

## 2021-06-06 NOTE — Assessment & Plan Note (Signed)
-   Corrected is 138, [place] ?

## 2021-06-06 NOTE — ED Notes (Addendum)
Blood sugar now 128. Target goal of 140-180 is met. Insulin drip is changed to 0.3 units/hr per endo tool recommendations.  ?

## 2021-06-06 NOTE — Assessment & Plan Note (Addendum)
-   At this time unclear if it is type I, 1.5, type II ?- Extensive discussion with patient that she will need close follow-up with PCP ?- With A1c of 14.1, patient is not a candidate for oral antihyperglycemic medications ?- Patient will need insulin, glucometer, lancets, glucose strips ?- Discussed extensively patient that she may be a candidate for continuous glucose monitoring and or insulin pump with good compliance ?- Advised patient to check her feet and toes daily and to have annual eye exams ?- She endorses understanding and compliance  ?- TOC consulted for PCP needs; diabetes coordinator education ordered ?

## 2021-06-06 NOTE — Assessment & Plan Note (Signed)
-   Patient clinical presentation is consistent with diabetes type 1 versus diabetes type 1.5 ?- Her A1c on 05/28/2021 is 14.1 ?- Patient is not a candidate for oral antiglycemic agents alone given the A1c above ?- Patient will need endocrinology referral and/or close follow-up with PCP in the management of diabetes ?- Patient will need insulin at this time and dosing will need to be adjusted ?- Admit to stepdown, inpatient, telemetry for insulin GTT ?

## 2021-06-07 DIAGNOSIS — E0800 Diabetes mellitus due to underlying condition with hyperosmolarity without nonketotic hyperglycemic-hyperosmolar coma (NKHHC): Secondary | ICD-10-CM

## 2021-06-07 DIAGNOSIS — E1110 Type 2 diabetes mellitus with ketoacidosis without coma: Secondary | ICD-10-CM | POA: Diagnosis present

## 2021-06-07 DIAGNOSIS — E1010 Type 1 diabetes mellitus with ketoacidosis without coma: Principal | ICD-10-CM

## 2021-06-07 LAB — CBG MONITORING, ED
Glucose-Capillary: 110 mg/dL — ABNORMAL HIGH (ref 70–99)
Glucose-Capillary: 111 mg/dL — ABNORMAL HIGH (ref 70–99)
Glucose-Capillary: 216 mg/dL — ABNORMAL HIGH (ref 70–99)

## 2021-06-07 LAB — GLUCOSE, CAPILLARY
Glucose-Capillary: 225 mg/dL — ABNORMAL HIGH (ref 70–99)
Glucose-Capillary: 134 mg/dL — ABNORMAL HIGH (ref 70–99)

## 2021-06-07 LAB — BASIC METABOLIC PANEL
Anion gap: 7 (ref 5–15)
Anion gap: 7 (ref 5–15)
BUN: 8 mg/dL (ref 6–20)
BUN: 8 mg/dL (ref 6–20)
CO2: 23 mmol/L (ref 22–32)
CO2: 24 mmol/L (ref 22–32)
Calcium: 8.5 mg/dL — ABNORMAL LOW (ref 8.9–10.3)
Calcium: 8.6 mg/dL — ABNORMAL LOW (ref 8.9–10.3)
Chloride: 106 mmol/L (ref 98–111)
Chloride: 106 mmol/L (ref 98–111)
Creatinine, Ser: 0.5 mg/dL (ref 0.44–1.00)
Creatinine, Ser: 0.53 mg/dL (ref 0.44–1.00)
GFR, Estimated: >60 mL/min (ref >60)
GFR, Estimated: >60 mL/min (ref >60)
Glucose, Bld: 121 mg/dL — ABNORMAL HIGH (ref 70–99)
Glucose, Bld: 196 mg/dL — ABNORMAL HIGH (ref 70–99)
Potassium: 3.1 mmol/L — ABNORMAL LOW (ref 3.5–5.1)
Potassium: 5 mmol/L (ref 3.5–5.1)
Sodium: 136 mmol/L (ref 135–145)
Sodium: 137 mmol/L (ref 135–145)

## 2021-06-07 LAB — BETA-HYDROXYBUTYRIC ACID: Beta-Hydroxybutyric Acid: 0.96 mmol/L — ABNORMAL HIGH (ref 0.05–0.27)

## 2021-06-07 MED ORDER — POTASSIUM CHLORIDE CRYS ER 20 MEQ PO TBCR
40.0000 meq | EXTENDED_RELEASE_TABLET | Freq: Once | ORAL | Status: AC
  Administered 2021-06-07: 40 meq via ORAL
  Filled 2021-06-07: qty 2

## 2021-06-07 MED ORDER — INSULIN ASPART 100 UNIT/ML IJ SOLN
0.0000 [IU] | Freq: Three times a day (TID) | INTRAMUSCULAR | Status: DC
  Administered 2021-06-07: 3 [IU] via SUBCUTANEOUS
  Administered 2021-06-07: 1 [IU] via SUBCUTANEOUS
  Administered 2021-06-07: 3 [IU] via SUBCUTANEOUS
  Filled 2021-06-07 (×3): qty 1

## 2021-06-07 MED ORDER — INSULIN ASPART 100 UNIT/ML FLEXPEN: PEN_INJECTOR | SUBCUTANEOUS | 11 refills | Status: DC

## 2021-06-07 MED ORDER — INSULIN GLARGINE-YFGN 100 UNIT/ML ~~LOC~~ SOLN
10.0000 [IU] | Freq: Every day | SUBCUTANEOUS | Status: DC
  Filled 2021-06-07: qty 0.1

## 2021-06-07 MED ORDER — INSULIN GLARGINE 100 UNIT/ML SOLOSTAR PEN: 10.0000 [IU] | PEN_INJECTOR | Freq: Every day | SUBCUTANEOUS | 11 refills | Status: DC

## 2021-06-07 MED ORDER — INSULIN ASPART 100 UNIT/ML IJ SOLN: 0.0000 [IU] | Freq: Every day | INTRAMUSCULAR | Status: DC

## 2021-06-07 MED ORDER — INSULIN GLARGINE-YFGN 100 UNIT/ML ~~LOC~~ SOLN
5.0000 [IU] | Freq: Once | SUBCUTANEOUS | Status: AC
  Administered 2021-06-07: 5 [IU] via SUBCUTANEOUS
  Filled 2021-06-07: qty 0.05

## 2021-06-07 NOTE — ED Notes (Signed)
Informed RN bed assigned 

## 2021-06-07 NOTE — Discharge Summary (Signed)
Physician Discharge Summary   Patient: Stacy George MRN: [id number] DOB: 26-Jul-1995  Admit date:     06/06/2021  Discharge date: 06/07/2021  Discharge Physician: Hollice EspySendil K Tallon Gertz   PCP: Pcp, No   Recommendations at discharge:   New medication: Levemir 10 units SQ nightly New medication: Humalog sliding scale  Discharge Diagnoses: Principal Problem:   DKA (diabetic ketoacidosis) (HCC) Active Problems:   DM (diabetes mellitus), type 1 (HCC)   Metabolic acidosis  Resolved Problems:   * No resolved hospital problems. *  Hospital Course: Patient is a 26 year old female with recent diagnosis of diabetes mellitus 2 weeks ago and at that time had been started on metformin.  CBG at 358 and A1c at 14.1 at that time.  Patient seen in the emergency room on 3/6 that time noted to have a glucose of 264 and not in DKA.  Treated for some vaginal candidiasis and sent home.  Patient started to feel worse and check sugars and found to be elevated in the 300s and return back to the emergency room on 3/9.  At that time, patient noted to be in DKA with a CBG of 482 and anion gap of 13.  Bicarb of 21.  Patient started on IV fluids and insulin drip and quickly CBGs improved.  She was transitioned over to Lantus.  Assessment and Plan: * DKA (diabetic ketoacidosis) (HCC) Quickly resolved with IV fluids and insulin drip.  Changed over to Lantus.  See below.  DM (diabetes mellitus), type 1 (HCC) Given patient's BMI of only 20 and her young age, I suspect that she is a type I or at best 1.5.  Metformin not ideally for her and she would benefit from insulin.  Patient is quite motivated and intelligent and would do well with medications.  Following diagnosis of diabetes, she attempted to establish with a PCP, but has not had a call back from the clinic that she called.  Once CBG stabilized, patient received Lantus and diabetes coordinator recommended 10 units SQ nightly plus Humalog sliding scale.  Patient also  given education for administration as well as referral for classes.  Patient was given 10 units of subcu Lantus prior to discharge on 3/10 evening and she will then follow-up at her pharmacy to pick this up.  Please note on 3/11, patient was not yet able to pick up her insulin.  Pharmacy called in due to insurance coverage was changed from Lantus and NovoLog to Levemir and Humalog which is covered.  Patient already had existing long-term insulin in her system from Lantus given prior to discharge and CBGs have been slightly elevated in the 200s.  Patient also given referral to [facility] for follow-up appointment.         Consultants: None Procedures performed: None Disposition: Home Diet recommendation:  Discharge Diet Orders (From admission, onward)     Start     Ordered   06/07/21 0000  Diet Carb Modified        06/07/21 1836           Carb modified diet DISCHARGE MEDICATION: Allergies as of 06/07/2021   No Known Allergies      Medication List     STOP taking these medications    cephALEXin 500 MG capsule Commonly known as: KEFLEX   metFORMIN 500 MG tablet Commonly known as: Glucophage       TAKE these medications    insulin aspart 100 UNIT/ML FlexPen Commonly known as: NOVOLOG Before meals: 121-150:  1 unit, 151-200: 2 units, 201-250: 3 units, 251-300: 5 units, 301-350: 7 units, 351-400: 9 units      Bedtime: 121-200: 0 units, 201-250: 2 units, 251-300: 3 units, 301-350: 4 units, 351-400: 5 units   insulin glargine 100 UNIT/ML Solostar Pen Commonly known as: LANTUS Inject 10 Units into the skin daily.   pantoprazole 40 MG tablet Commonly known as: Protonix Take 1 tablet (40 mg total) by mouth daily.        Follow-up Information     Bacigalupo, Marzella Schlein, MD Follow up.   Specialty: Family Medicine Why: They will call you next week to set up new appointment Contact information: [address] [place] [address] [place]  [phone]                Discharge Exam: Ceasar Mons Weights   06/06/21 1631  Weight: 56.2 kg   General: Alert and oriented x3, no acute distress Cardiovascular: Regular rate and rhythm, S1-S2 Lungs: Clear to auscultation bilaterally  Condition at discharge: good  The results of significant diagnostics from this hospitalization (including imaging, microbiology, ancillary and laboratory) are listed below for reference.   Imaging Studies: Portable chest x-ray (1 view)  Result Date: 06/06/2021 CLINICAL DATA:  Nausea EXAM: PORTABLE CHEST 1 VIEW COMPARISON:  None. FINDINGS: Cardiac and mediastinal contours are within normal limits. No focal pulmonary opacity. No pleural effusion or pneumothorax. No acute osseous abnormality. IMPRESSION: No acute cardiopulmonary process. Electronically Signed   By: Wiliam Ke M.D.   On: 06/06/2021 19:55    Microbiology: Results for orders placed or performed during the hospital encounter of 06/06/21  Resp Panel by RT-PCR (Flu A&B, Covid) Nasopharyngeal Swab     Status: None   Collection Time: 06/06/21  7:27 PM   Specimen: Nasopharyngeal Swab; Nasopharyngeal(NP) swabs in vial transport medium  Result Value Ref Range Status   SARS Coronavirus 2 by RT PCR NEGATIVE NEGATIVE Final    Comment: (NOTE) SARS-CoV-2 target nucleic acids are NOT DETECTED.  The SARS-CoV-2 RNA is generally detectable in upper respiratory specimens during the acute phase of infection. The lowest concentration of SARS-CoV-2 viral copies this assay can detect is 138 copies/mL. A negative result does not preclude SARS-Cov-2 infection and should not be used as the sole basis for treatment or other patient management decisions. A negative result may occur with  improper specimen collection/handling, submission of specimen other than nasopharyngeal swab, presence of viral mutation(s) within the areas targeted by this assay, and inadequate number of viral copies(<138  copies/mL). A negative result must be combined with clinical observations, patient history, and epidemiological information. The expected result is Negative.  Fact Sheet for Patients:  BloggerCourse.com  Fact Sheet for Healthcare Providers:  SeriousBroker.it  This test is no t yet approved or cleared by the [place] FDA and  has been authorized for detection and/or diagnosis of SARS-CoV-2 by FDA under an Emergency Use Authorization (EUA). This EUA will remain  in effect (meaning this test can be used) for the duration of the COVID-19 declaration under Section 564(b)(1) of the Act, 21 U.S.C.section 360bbb-3(b)(1), unless the authorization is terminated  or revoked sooner.       Influenza A by PCR NEGATIVE NEGATIVE Final   Influenza B by PCR NEGATIVE NEGATIVE Final    Comment: (NOTE) The Xpert Xpress SARS-CoV-2/FLU/RSV plus assay is intended as an aid in the diagnosis of influenza from Nasopharyngeal swab specimens and should not be used as a sole basis for treatment. Nasal washings  and aspirates are unacceptable for Xpert Xpress SARS-CoV-2/FLU/RSV testing.  Fact Sheet for Patients: BloggerCourse.com  Fact Sheet for Healthcare Providers: SeriousBroker.it  This test is not yet approved or cleared by the [place] FDA and has been authorized for detection and/or diagnosis of SARS-CoV-2 by FDA under an Emergency Use Authorization (EUA). This EUA will remain in effect (meaning this test can be used) for the duration of the COVID-19 declaration under Section 564(b)(1) of the Act, 21 U.S.C. section 360bbb-3(b)(1), unless the authorization is terminated or revoked.  Performed at [facility], [address] [place], [place] [address]     Labs: CBC: Recent Labs  Lab 06/03/21 1833 06/06/21 1637  WBC 12.0* 10.4  HGB 13.4 14.4  HCT 40.9 48.9*  MCV 87.4 97.8   PLT 336 372   Basic Metabolic Panel: Recent Labs  Lab 06/03/21 1518 06/03/21 1833 06/06/21 1637 06/07/21 0009 06/07/21 0511  NA 136 132* 131* 137 136  K 4.0 3.7 3.6 3.1* 5.0  CL 101 103 99 106 106  CO2 18* 22 21* 24 23  GLUCOSE 272* 264* 379* 121* 196*  BUN 12 14 10 8 8   CREATININE 0.55* 0.57 0.52 0.50 0.53  CALCIUM 9.6 8.8* 9.5 8.6* 8.5*   Liver Function Tests: Recent Labs  Lab 06/03/21 1518 06/03/21 1833 06/06/21 1637  AST 11 13* 14*  ALT 8 11 10   ALKPHOS 94 71 75  BILITOT 0.4 0.8 1.1  PROT 7.0 7.3 7.4  ALBUMIN 4.7 4.4 4.4   CBG: Recent Labs  Lab 06/07/21 0012 06/07/21 0122 06/07/21 0802 06/07/21 1335 06/07/21 1607  GLUCAP 110* 111* 216* 225* 134*    Discharge time spent: greater than 30 minutes.  Signed: 08/07/21, MD [facility] 06/08/2021

## 2021-06-07 NOTE — Progress Notes (Signed)
Admission profile updated. ?

## 2021-06-07 NOTE — ED Notes (Signed)
Diabetes RN at bedside.

## 2021-06-07 NOTE — ED Notes (Signed)
Pt resting comfortably in bed at this time. Pt is alert and oriented with even and regular respirations. No acute distress noted. Pt denies any needs at this time. Call light within reach.  °

## 2021-06-07 NOTE — Progress Notes (Signed)
Patient was given verbal and written discharge instructions, she acknowledge understanding and states she will comply with appointments and take all medications.Patient walked out to car, no distress when leaving the floor. ?

## 2021-06-07 NOTE — Progress Notes (Signed)
Nutrition Brief Note ? ?Patient identified on the Malnutrition Screening Tool (MST) Report ? ?Wt Readings from Last 15 Encounters:  ?06/06/21 56.2 kg  ?06/03/21 56.2 kg  ?05/24/21 56.4 kg  ?03/27/21 59 kg  ? ?Stacy George is a 89 with prior diagnosis of diabetes and history of polyuria and polydipsia, presents emergency department from home for chief concerns of hyperglycemia. ? ?Pt admitted with hyperglycemia and new onset type 1 DM.  ? ?Spoke with pt and fiance at bedside. Pt reports having a good handle on DM self-management. They greatly appreciated visit with DM coordinator and were able to teach back to this RD what was discussed during that visit. Pt is eager to learn how to do insulin injections herself so that she can go home.  ? ?Pt shares that she has a good appetite. She consumes 2 meals per day (meat, starch, and vegetable) and is trying to incorporate breakfast in her routine. RD dicussed importance of consistent meal schedule to prevent hypoglycemia. RD also discussed rule of 15 and how to treat hypoglycemia. Teachback method used. Pt with no further questions, but appreciative of visit.  ? ?RD will refer pt to [facility] for further support.  ? ?Reviewed wt hx; wt has been stable over the past month. Pt and fiance deny wt loss.  ? ?Nutrition-Focused physical exam completed. Findings are no fat depletion, no muscle depletion, and no edema.   ? ?Lab Results  ?Component Value Date  ? HGBA1C 14.1 (H) 05/28/2021  ? PTA DM medications are 500 mg metformin BID.  ? ?Labs reviewed: CBGS: 110-225 (inpatient orders for glycemic control are 0-5 units insulin aspart daily at bedtime, 0-9 units insulin aspart TID with meals and 10 units insulin glargine daily at bedtime).   ? ?Current diet order is carb modified, patient is consuming approximately 100% of meals at this time. Labs and medications reviewed.  ? ?No nutrition interventions warranted at this time. If  nutrition issues arise, please consult RD.  ? ?Levada Schilling, RD, LDN, CDCES ?Registered Dietitian II ?Certified Diabetes Care and Education Specialist ?Please refer to [facility] for RD and/or RD on-call/weekend/after hours pager   ?

## 2021-06-07 NOTE — Discharge Instructions (Signed)
Plate Method for Diabetes   Foods with carbohydrates make your blood glucose level go up. The plate method is a simple way to meal plan and control the amount of carbohydrate you eat.         Use the following guidance to build a healthy plate to control carbohydrates. Divide a 9-inch plate into 3 sections, and consider your beverage the 4th section of your meal: Food Group Examples of Foods/Beverages for This Section of your Meal  Section 1: Non-starchy vegetables Fill  of your plate to include non-starchy vegetables Asparagus, broccoli, brussels sprouts, cabbage, carrots, cauliflower, celery, cucumber, green beans, mushrooms, peppers, salad greens, tomatoes, or zucchini.  Section 2: Protein foods Fill  of your plate to include a lean protein Lean meat, poultry, fish, seafood, cheese, eggs, lean deli meat, tofu, beans, lentils, nuts or nut butters.  Section 3: Carbohydrate foods Fill  of your plate to include carbohydrate foods Whole grains, whole wheat bread, brown rice, whole grain pasta, polenta, corn tortillas, fruit, or starchy vegetables (potatoes, green peas, corn, beans, acorn squash, and butternut squash). One cup of milk also counts as a food that contains carbohydrate.  Section 4: Beverage Choose water or a low-calorie drink for your beverage. Unsweetened tea, coffee, or flavored/sparkling water without added sugar.  Image reprinted with permission from The American Diabetes Association.  Copyright 2022 by the American Diabetes Association.   Copyright 2022  Academy of Nutrition and Dietetics. All rights reserved   Carbohydrate Counting For People With Diabetes  Foods with carbohydrates make your blood glucose level go up. Learning how to count carbohydrates can help you control your blood glucose levels. First, identify the foods you eat that contain carbohydrates. Then, using the Foods with Carbohydrates chart, determine about how much carbohydrates are in your meals and  snacks. Make sure you are eating foods with fiber, protein, and healthy fat along with your carbohydrate foods. Foods with Carbohydrates The following table shows carbohydrate foods that have about 15 grams of carbohydrate each. Using measuring cups, spoons, or a food scale when you first begin learning about carbohydrate counting can help you learn about the portion sizes you typically eat. The following foods have 15 grams carbohydrate each:  Grains 1 slice bread (1 ounce)  1 small tortilla (6-inch size)   large bagel (1 ounce)  1/3 cup pasta or rice (cooked)   hamburger or hot dog bun ( ounce)   cup cooked cereal   to  cup ready-to-eat cereal  2 taco shells (5-inch size) Fruit 1 small fresh fruit ( to 1 cup)   medium banana  17 small grapes (3 ounces)  1 cup melon or berries   cup canned or frozen fruit  2 tablespoons dried fruit (blueberries, cherries, cranberries, raisins)   cup unsweetened fruit juice  Starchy Vegetables  cup cooked beans, peas, corn, potatoes/sweet potatoes   large baked potato (3 ounces)  1 cup acorn or butternut squash  Snack Foods 3 to 6 crackers  8 potato chips or 13 tortilla chips ( ounce to 1 ounce)  3 cups popped popcorn  Dairy 3/4 cup (6 ounces) nonfat plain yogurt, or yogurt with sugar-free sweetener  1 cup milk  1 cup plain rice, soy, coconut or flavored almond milk Sweets and Desserts  cup ice cream or frozen yogurt  1 tablespoon jam, jelly, pancake syrup, table sugar, or honey  2 tablespoons light pancake syrup  1 inch square of frosted cake or 2 inch square of unfrosted  cake  2 small cookies (2/3 ounce each) or  large cookie  Sometimes youll have to estimate carbohydrate amounts if you dont know the exact recipe. One cup of mixed foods like soups can have 1 to 2 carbohydrate servings, while some casseroles might have 2 or more servings of carbohydrate. Foods that have less than 20 calories in each serving can be counted as  free foods. Count 1 cup raw vegetables, or  cup cooked non-starchy vegetables as free foods. If you eat 3 or more servings at one meal, then count them as 1 carbohydrate serving.  Foods without Carbohydrates  Not all foods contain carbohydrates. Meat, some dairy, fats, non-starchy vegetables, and many beverages dont contain carbohydrate. So when you count carbohydrates, you can generally exclude chicken, pork, beef, fish, seafood, eggs, tofu, cheese, butter, sour cream, avocado, nuts, seeds, olives, mayonnaise, water, black coffee, unsweetened tea, and zero-calorie drinks. Vegetables with no or low carbohydrate include green beans, cauliflower, tomatoes, and onions. How much carbohydrate should I eat at each meal?  Carbohydrate counting can help you plan your meals and manage your weight. Following are some starting points for carbohydrate intake at each meal. Work with your registered dietitian nutritionist to find the best range that works for your blood glucose and weight.   To Lose Weight To Maintain Weight  Women 2 - 3 carb servings 3 - 4 carb servings  Men 3 - 4 carb servings 4 - 5 carb servings  Checking your blood glucose after meals will help you know if you need to adjust the timing, type, or number of carbohydrate servings in your meal plan. Achieve and keep a healthy body weight by balancing your food intake and physical activity.  Tips How should I plan my meals?  Plan for half the food on your plate to include non-starchy vegetables, like salad greens, broccoli, or carrots. Try to eat 3 to 5 servings of non-starchy vegetables every day. Have a protein food at each meal. Protein foods include chicken, fish, meat, eggs, or beans (note that beans contain carbohydrate). These two food groups (non-starchy vegetables and proteins) are low in carbohydrate. If you fill up your plate with these foods, you will eat less carbohydrate but still fill up your stomach. Try to limit your carbohydrate  portion to  of the plate.  What fats are healthiest to eat?  Diabetes increases risk for heart disease. To help protect your heart, eat more healthy fats, such as olive oil, nuts, and avocado. Eat less saturated fats like butter, cream, and high-fat meats, like bacon and sausage. Avoid trans fats, which are in all foods that list partially hydrogenated oil as an ingredient. What should I drink?  Choose drinks that are not sweetened with sugar. The healthiest choices are water, carbonated or seltzer waters, and tea and coffee without added sugars.  Sweet drinks will make your blood glucose go up very quickly. One serving of soda or energy drink is  cup. It is best to drink these beverages only if your blood glucose is low.  Artificially sweetened, or diet drinks, typically do not increase your blood glucose if they have zero calories in them. Read labels of beverages, as some diet drinks do have carbohydrate and will raise your blood glucose. Label Reading Tips Read Nutrition Facts labels to find out how many grams of carbohydrate are in a food you want to eat. Dont forget: sometimes serving sizes on the label arent the same as how much food you  are going to eat, so you may need to calculate how much carbohydrate is in the food you are serving yourself.   Carbohydrate Counting for People with Diabetes Sample 1-Day Menu  Breakfast  cup yogurt, low fat, low sugar (1 carbohydrate serving)   cup cereal, ready-to-eat, unsweetened (1 carbohydrate serving)  1 cup strawberries (1 carbohydrate serving)   cup almonds ( carbohydrate serving)  Lunch 1, 5 ounce can chunk light tuna  2 ounces cheese, low fat cheddar  6 whole wheat crackers (1 carbohydrate serving)  1 small apple (1 carbohydrate servings)   cup carrots ( carbohydrate serving)   cup snap peas  1 cup 1% milk (1 carbohydrate serving)   Evening Meal Stir fry made with: 3 ounces chicken  1 cup brown rice (3 carbohydrate servings)    cup broccoli ( carbohydrate serving)   cup green beans   cup onions  1 tablespoon olive oil  2 tablespoons teriyaki sauce ( carbohydrate serving)  Evening Snack 1 extra small banana (1 carbohydrate serving)  1 tablespoon peanut butter   Carbohydrate Counting for People with Diabetes Vegan Sample 1-Day Menu  Breakfast 1 cup cooked oatmeal (2 carbohydrate servings)   cup blueberries (1 carbohydrate serving)  2 tablespoons flaxseeds  1 cup soymilk fortified with calcium and vitamin D  1 cup coffee  Lunch 2 slices whole wheat bread (2 carbohydrate servings)   cup baked tofu   cup lettuce  2 slices tomato  2 slices avocado   cup baby carrots ( carbohydrate serving)  1 orange (1 carbohydrate serving)  1 cup soymilk fortified with calcium and vitamin D   Evening Meal Burrito made with: 1 6-inch corn tortilla (1 carbohydrate serving)  1 cup refried vegetarian beans (2 carbohydrate servings)   cup chopped tomatoes   cup lettuce   cup salsa  1/3 cup brown rice (1 carbohydrate serving)  1 tablespoon olive oil for rice   cup zucchini   Evening Snack 6 small whole grain crackers (1 carbohydrate serving)  2 apricots ( carbohydrate serving)   cup unsalted peanuts ( carbohydrate serving)    Carbohydrate Counting for People with Diabetes Vegetarian (Lacto-Ovo) Sample 1-Day Menu  Breakfast 1 cup cooked oatmeal (2 carbohydrate servings)   cup blueberries (1 carbohydrate serving)  2 tablespoons flaxseeds  1 egg  1 cup 1% milk (1 carbohydrate serving)  1 cup coffee  Lunch 2 slices whole wheat bread (2 carbohydrate servings)  2 ounces low-fat cheese   cup lettuce  2 slices tomato  2 slices avocado   cup baby carrots ( carbohydrate serving)  1 orange (1 carbohydrate serving)  1 cup unsweetened tea  Evening Meal Burrito made with: 1 6-inch corn tortilla (1 carbohydrate serving)   cup refried vegetarian beans (1 carbohydrate serving)   cup tomatoes   cup lettuce    cup salsa  1/3 cup brown rice (1 carbohydrate serving)  1 tablespoon olive oil for rice   cup zucchini  1 cup 1% milk (1 carbohydrate serving)  Evening Snack 6 small whole grain crackers (1 carbohydrate serving)  2 apricots ( carbohydrate serving)   cup unsalted peanuts ( carbohydrate serving)    Copyright 2020  Academy of Nutrition and Dietetics. All rights reserved.  Using Nutrition Labels: Carbohydrate  Serving Size  Look at the serving size. All the information on the label is based on this portion. Servings Per Container  The number of servings contained in the package. Guidelines for Carbohydrate  Look at the  total grams of carbohydrate in the serving size.  1 carbohydrate choice = 15 grams of carbohydrate. Range of Carbohydrate Grams Per Choice  Carbohydrate Grams/Choice Carbohydrate Choices  6-10   11-20 1  21-25 1  26-35 2  36-40 2  41-50 3  51-55 3  56-65 4  66-70 4  71-80 5    Copyright 2020  Academy of Nutrition and Dietetics. All rights reserved.

## 2021-06-07 NOTE — ED Notes (Signed)
Rounding hospitalist at bedside.  ?

## 2021-06-07 NOTE — Progress Notes (Signed)
Inpatient Diabetes Program Recommendations ? ?AACE/ADA: New Consensus Statement on Inpatient Glycemic Control (2015) ? ?Target Ranges:  Prepandial:   less than 140 mg/dL ?     Peak postprandial:   less than 180 mg/dL (1-2 hours) ?     Critically ill patients:  140 - 180 mg/dL  ? ?Lab Results  ?Component Value Date  ? GLUCAP 216 (H) 06/07/2021  ? HGBA1C 14.1 (H) 05/28/2021  ? ? ?Review of Glycemic Control ? ?Diabetes history: recent diagnosis ?Outpatient Diabetes medications: metformin ?Current orders for Inpatient glycemic control:  ?Novolog 0-9 units tid + hs ? ?Semglee 5 units x 1 dose given after IV insulin discontinued ? ?Inpatient Diabetes Program Recommendations:   ? ?-  Increase Semglee to 10 units (0.2 units/kg) ? ?Spoke with patient about new diabetes diagnosis, most likely type 1.  Discussed A1C results (14.1%) and explained what an A1C is. Pt reports her cousin was a Engineer, civil (consulting) and urged her to see medical help. Discussed basic pathophysiology of DM Type 1, basic home care, importance of checking CBGs and maintaining good CBG control to prevent long-term and short-term complications. Reviewed glucose and A1C goals. Discussed carb counting in detail. Covered in detail the different types of insulin, when, how, where to take insulin.  Reviewed signs and symptoms of hyperglycemia and hypoglycemia along with treatment for both. Discussed impact of nutrition, exercise, stress, sickness, and medications on diabetes control.  ? ?Pt has a fingerstick meter from [employer] at home. ? ?-   Gave pt freestyle Libre 2 sensors and downloaded app on phone. ? ?Asked patient to check her glucose 4 times per day (before meals and at bedtime) and to keep a log book of glucose readings and insulin taken. Explained how the doctor he follows up with can use the log book to continue to make insulin adjustments if needed. Reviewed and demonstrated how to draw up and administer insulin with insulin pen. Patient was able to successfully  demonstrate how to draw up and administer insulin with vial and syringe. Informed patient that RN will be asking him to self-administer insulin to ensure proper technique and ability to administer self insulin shots.   Patient verbalized understanding of information discussed and has no further questions at this time related to diabetes. RNs to provide ongoing basic DM education at bedside with this patient and engage patient to actively check blood glucose and administer insulin injections.  ? ?D/c: ?Insulin pen needles order # [id number] ?Freestyle Libre 2 sensors order [phone] ? ? ?Thanks, ?Christena Deem RN, MSN, BC-ADM ?Inpatient Diabetes Coordinator ?Team Pager [phone] (8a-5p) ?

## 2021-06-07 NOTE — ED Notes (Signed)
Pt resting in bed. Appears to be sleeping at this time. Chest is rising and falling symmetrically. No acute distress noted. Will continue to monitor.   °

## 2021-06-07 NOTE — Progress Notes (Signed)
Met with the patient at the bedside, provided with a list of PCPs, she stated that she is making calls to get in with a PCP, She is working with Diabetes education and will continue as outpatient, she lives at home with her fiance and has transportation, she can afford her medication ?She is independent at home,. ?No additional needs ?

## 2021-06-08 ENCOUNTER — Other Ambulatory Visit: Payer: Self-pay | Admitting: Internal Medicine

## 2021-06-08 MED ORDER — INSULIN LISPRO 200 UNIT/ML ~~LOC~~ SOPN: PEN_INJECTOR | SUBCUTANEOUS | 11 refills | Status: DC

## 2021-06-08 MED ORDER — INSULIN DETEMIR 100 UNIT/ML FLEXPEN: 10.0000 [IU] | PEN_INJECTOR | Freq: Every day | SUBCUTANEOUS | 11 refills | Status: DC

## 2021-06-08 NOTE — Assessment & Plan Note (Signed)
Quickly resolved with IV fluids and insulin drip.  Changed over to Lantus.  See below. ?

## 2021-06-08 NOTE — Assessment & Plan Note (Signed)
Given patient's BMI of only 20 and her young age, I suspect that she is a type I or at best 1.5.  Metformin not ideally for her and she would benefit from insulin.  Patient is quite motivated and intelligent and would do well with medications.  Following diagnosis of diabetes, she attempted to establish with a PCP, but has not had a call back from the clinic that she called.  Once CBG stabilized, patient received Lantus and diabetes coordinator recommended 10 units SQ nightly plus Humalog sliding scale.  Patient also given education for administration as well as referral for classes.  Patient was given 10 units of subcu Lantus prior to discharge on 3/10 evening and she will then follow-up at her pharmacy to pick this up. ? ?Please note on 3/11, patient was not yet able to pick up her insulin.  Pharmacy called in due to insurance coverage was changed from Lantus and NovoLog to Levemir and Humalog which is covered.  Patient already had existing long-term insulin in her system from Lantus given prior to discharge and CBGs have been slightly elevated in the 200s.  Patient also given referral to [facility] family practice for follow-up appointment. ?

## 2021-06-08 NOTE — Hospital Course (Signed)
Patient is a 26 year old female with recent diagnosis of diabetes mellitus 2 weeks ago and at that time had been started on metformin.  CBG at 358 and A1c at 14.1 at that time.  Patient seen in the emergency room on 3/6 that time noted to have a glucose of 264 and not in DKA.  Treated for some vaginal candidiasis and sent home.  Patient started to feel worse and check sugars and found to be elevated in the 300s and return back to the emergency room on 3/9.  At that time, patient noted to be in DKA with a CBG of 482 and anion gap of 13.  Bicarb of 21.  Patient started on IV fluids and insulin drip and quickly CBGs improved.  She was transitioned over to Lantus. ?

## 2021-06-08 NOTE — Progress Notes (Unsigned)
{  Select_TRH_Note:[id number]} 

## 2021-06-09 ENCOUNTER — Encounter: Payer: Self-pay | Admitting: Obstetrics

## 2021-06-09 LAB — BETA-HYDROXYBUTYRIC ACID: Beta-Hydroxybutyrate: 21 mg/dL — ABNORMAL HIGH

## 2021-06-20 ENCOUNTER — Encounter: Payer: BC Managed Care – PPO | Attending: Internal Medicine | Admitting: *Deleted

## 2021-06-20 ENCOUNTER — Encounter: Payer: Self-pay | Admitting: *Deleted

## 2021-06-20 ENCOUNTER — Other Ambulatory Visit: Payer: Self-pay

## 2021-06-20 VITALS — BP 102/70 | Ht 65.0 in | Wt 134.1 lb

## 2021-06-20 DIAGNOSIS — E109 Type 1 diabetes mellitus without complications: Secondary | ICD-10-CM

## 2021-06-20 DIAGNOSIS — E119 Type 2 diabetes mellitus without complications: Secondary | ICD-10-CM | POA: Diagnosis present

## 2021-06-20 DIAGNOSIS — E0800 Diabetes mellitus due to underlying condition with hyperosmolarity without nonketotic hyperglycemic-hyperosmolar coma (NKHHC): Secondary | ICD-10-CM | POA: Diagnosis present

## 2021-06-20 LAB — HM DIABETES EYE EXAM

## 2021-06-20 NOTE — Patient Instructions (Addendum)
Check blood sugars before each meal, before bed every day and as needed with FreeStyle Libre ?Bring blood sugar records/phone to the next appointment ? ?Exercise: Continue walking for   60  minutes   6  days a week ? ?Eat 3 meals day,   1-2  snacks a day ?Space meals 4-6 hours apart ?Allow 2-3 hours between meals and snacks ?Include 1 serving of protein with breakfast ?Limit fruit at bedtime if morning blood sugars elevated ? ?Complete 3 Day Food Record and bring to next appt ? ?Carry fast acting glucose and a snack at all times ?Rotate injection sites ? ?Return for appointment on:   Monday Jul 29, 2021 @ 2:30 pm with Elita Quick (dietitian) ?

## 2021-06-20 NOTE — Progress Notes (Signed)
Diabetes Self-Management Education ? ?Visit Type: First/Initial ? ?Appt. Start Time: 0850 Appt. End Time: 100 ? ?06/20/2021 ? ?[name], identified by name and date of birth, is a 26 y.o. female with a diagnosis of Diabetes: Type 1.  ? ?ASSESSMENT ? ?Blood pressure 102/70, height 5\' 5"  (1.651 m), weight 134 lb 1.6 oz (60.8 kg). ?Body mass index is 22.32 kg/m?. ? ? Diabetes Self-Management Education - 06/20/21 1007   ? ?  ? Visit Information  ? Visit Type First/Initial   ?  ? Initial Visit  ? Diabetes Type Type 1   ? Are you currently following a meal plan? Yes   ? What type of meal plan do you follow? "sub out items with less carbs, watch out for sugar in items"   ? Are you taking your medications as prescribed? Yes   ? Date Diagnosed 2 weeks   ?  ? Health Coping  ? How would you rate your overall health? Fair   ?  ? Psychosocial Assessment  ? Patient Belief/Attitude about Diabetes Other (comment)   "sad, unmotivated"  ? Self-care barriers None   ? Self-management support Doctor's office;Family   ? Patient Concerns Nutrition/Meal planning;Glycemic Control;Monitoring;Healthy Lifestyle   ? Special Needs None   ? Preferred Learning Style Visual;Hands on   ? Learning Readiness Change in progress   ? How often do you need to have someone help you when you read instructions, pamphlets, or other written materials from your doctor or pharmacy? 1 - Never   ? What is the last grade level you completed in school? high school   ?  ? Pre-Education Assessment  ? Patient understands the diabetes disease and treatment process. Needs Instruction   ? Patient understands incorporating nutritional management into lifestyle. Needs Review   ? Patient undertands incorporating physical activity into lifestyle. Needs Review   ? Patient understands using medications safely. Needs Review   ? Patient understands monitoring blood glucose, interpreting and using results Needs Review   ? Patient understands prevention, detection, and  treatment of acute complications. Needs Review   ? Patient understands prevention, detection, and treatment of chronic complications. Needs Review   ? Patient understands how to develop strategies to address psychosocial issues. Needs Instruction   ? Patient understands how to develop strategies to promote health/change behavior. Needs Instruction   ?  ? Complications  ? Last HgB A1C per patient/outside source 14.1 %   05/28/2021  ? How often do you check your blood sugar? > 4 times/day   FreeStyle Libre - 13 day average 185 mg/dL; 05/30/2021 - >161, 09% - 34%, 70-180 - 49%, <70 - 0%  ? Number of hypoglycemic episodes per month 0   She has glucose tablets and snack for treatment.  ? Have you had a dilated eye exam in the past 12 months? No   appt today  ? Have you had a dental exam in the past 12 months? No   appt next week  ? Are you checking your feet? Yes   ? How many days per week are you checking your feet? 7   ?  ? Dietary Intake  ? Breakfast fruit, oats; egg, sausage, potatoes (35 grams)   ? Snack (morning) Extend granola bar (17 grams)   ? Lunch left-overs   ? Dinner chicken, beef, fish, sweet potatoes or regular potatoes, wheat bread, peas, beans, corn, green beans, brown rice, some pasta, broccoli, cauliflower, zucchini, squash, salads with lettuce salmon, cheese, avocado   ?  Snack (evening) fruit (orange, apple)   ? Beverage(s) water, unsweet tea, diet soda   ?  ? Exercise  ? Exercise Type Light (walking / raking leaves)   weighted exercises  ? How many days per week to you exercise? 6   ? How many minutes per day do you exercise? 60   ? Total minutes per week of exercise 360   ?  ? Patient Education  ? Previous Diabetes Education No   ? Disease state  Definition of diabetes, type 1 and 2, and the diagnosis of diabetes;Factors that contribute to the development of diabetes   ? Nutrition management  Role of diet in the treatment of diabetes and the relationship between the three main macronutrients and blood  glucose level;Food label reading, portion sizes and measuring food.;Carbohydrate counting;Reviewed blood glucose goals for pre and post meals and how to evaluate the patients' food intake on their blood glucose level.;Meal timing in regards to the patients' current diabetes medication.   ? Physical activity and exercise  Role of exercise on diabetes management, blood pressure control and cardiac health.   ? Medications Taught/reviewed insulin injection, site rotation, insulin storage and needle disposal.;Reviewed patients medication for diabetes, action, purpose, timing of dose and side effects.   ? Monitoring Taught/discussed recording of test results and interpretation of SMBG.;Identified appropriate SMBG and/or A1C goals.;Yearly dilated eye exam   Use of CGM and Time in Range  ? Acute complications Taught treatment of hypoglycemia - the 15 rule.   ? Chronic complications Relationship between chronic complications and blood glucose control   ? Psychosocial adjustment Identified and addressed patients feelings and concerns about diabetes   ?  ? Individualized Goals (developed by patient)  ? Reducing Risk Other (comment)   improve blood sugars, pevent diabetes complications, lead a healthier lifestyle, become more fit  ?  ? Outcomes  ? Expected Outcomes Demonstrated interest in learning. Expect positive outcomes   ? Future DMSE 4-6 wks   ? ?  ?  ?Individualized Plan for Diabetes Self-Management Training:  ? ?Learning Objective:  Patient will have a greater understanding of diabetes self-management. ?Patient education plan is to attend individual and/or group sessions per assessed needs and concerns. ?  ?Plan:  ? ?Patient Instructions  ?Check blood sugars before each meal, before bed every day and as needed with FreeStyle Libre ?Bring blood sugar records/phone to the next appointment ? ?Exercise: Continue walking for   60  minutes   6  days a week ? ?Eat 3 meals day,   1-2  snacks a day ?Space meals 4-6 hours  apart ?Allow 2-3 hours between meals and snacks ?Include 1 serving of protein with breakfast ?Limit fruit at bedtime if morning blood sugars elevated ? ?Complete 3 Day Food Record and bring to next appt ? ?Carry fast acting glucose and a snack at all times ?Rotate injection sites ? ?Return for appointment on:   Monday Jul 29, 2021 @ 2:30 pm with Elita Quick (dietitian) ? ?Expected Outcomes:  Demonstrated interest in learning. Expect positive outcomes ? ?Education material provided:  ?Advertising account executive Guidelines ?Simple Meal Plan ?3 Day Food Record ?Symptoms, causes and treatments of Hypoglycemia ?Healthy Snack Choices (ADA) ?Making Choices Using Food Labels (ADA) ?FreeStyle [place] AVS ? ?If problems or questions, patient to contact team via:   ?Sharion Settler, RN, CCM, CDCES [phone] ? ?Future DSME appointment: 4-6 wks ?Jul 29, 2021 with the dietitian ?

## 2021-06-21 ENCOUNTER — Ambulatory Visit (INDEPENDENT_AMBULATORY_CARE_PROVIDER_SITE_OTHER): Payer: BC Managed Care – PPO | Admitting: Physician Assistant

## 2021-06-21 ENCOUNTER — Encounter: Payer: Self-pay | Admitting: Physician Assistant

## 2021-06-21 VITALS — BP 101/72 | HR 72 | Ht 65.0 in | Wt 130.9 lb

## 2021-06-21 DIAGNOSIS — R350 Frequency of micturition: Secondary | ICD-10-CM

## 2021-06-21 DIAGNOSIS — Z803 Family history of malignant neoplasm of breast: Secondary | ICD-10-CM

## 2021-06-21 DIAGNOSIS — E1065 Type 1 diabetes mellitus with hyperglycemia: Secondary | ICD-10-CM | POA: Diagnosis not present

## 2021-06-21 DIAGNOSIS — Z800 Family history of malignant neoplasm of digestive organs: Secondary | ICD-10-CM | POA: Diagnosis not present

## 2021-06-21 MED ORDER — PEN NEEDLES 31G X 5 MM MISC: 3 refills | Status: DC

## 2021-06-21 NOTE — Progress Notes (Signed)
?I,Agusta Hackenberg,acting as a scribe for [employer], PA-C.,have documented all relevant documentation on the behalf of Mikey Kirschner, PA-C,as directed by  Mikey Kirschner, PA-C while in the presence of Mikey Kirschner, PA-C. ? ?New Patient Office Visit ? ?Subjective:  ?Patient ID: Stacy George, female    DOB: 20-Oct-1995  Age: 26 y.o. MRN: [id number] ? ?CC: new patient, recent dx of Type 1 DM ? ?Stacy George presents for new patient visit as she was recently diagnosed with diabetes.  ?She has been experiencing urinary issues including nocturia, frequent yeast infections, vision changes, overall feeling unwell for at least 6-7 months. A recent ED visit demonstrated DKA and she was started on insulin with the suspicion for type 1 diabetes due to her age and BMI.  ?She has been set up with a dietician, and currently manages her sugar with freestyle libre 2, 10 U of levemir and mealtime humalog.  ? ?Her urinary and vision symptoms have resolved.  ? ?  06/21/2021  ?  2:04 PM  ?GAD 7 : Generalized Anxiety Score  ?Nervous, Anxious, on Edge 3  ?Control/stop worrying 2  ?Worry too much - different things 2  ?Trouble relaxing 2  ?Restless 2  ?Easily annoyed or irritable 3  ?Afraid - awful might happen 0  ?Total GAD 7 Score 14  ?Anxiety Difficulty Not difficult at all  ? ?  ? ?  06/21/2021  ?  1:59 PM 06/20/2021  ?  8:58 AM  ?Depression screen PHQ 2/9  ?Decreased Interest 2 0  ?Down, Depressed, Hopeless 1 0  ?PHQ - 2 Score 3 0  ?Altered sleeping 1   ?Tired, decreased energy 2   ?Change in appetite 2   ?Feeling bad or failure about yourself  0   ?Trouble concentrating 3   ?Moving slowly or fidgety/restless 0   ?Suicidal thoughts 0   ?PHQ-9 Score 11   ?Difficult doing work/chores Somewhat difficult   ?  ? ?Past Medical History:  ?Diagnosis Date  ? Anxiety   ? Diabetes mellitus without complication ([place])   ? Migraines   ? Periodontal disease   ? Seasonal allergies   ? ? ?Past Surgical History:  ?Procedure Laterality Date  ?  KNEE SURGERY    ? cyst and fluid removed from behind knee cap  ? NASAL POLYP EXCISION    ? periodontal surgery    ? WISDOM TOOTH EXTRACTION    ? ? ?Family History  ?Problem Relation Age of Onset  ? Hypertension Maternal Grandmother   ? Diabetes Maternal Grandfather   ? Diabetes Paternal Grandmother   ? Breast cancer Paternal Grandmother   ?     not sure of age  ? Pancreatic cancer Paternal Grandmother   ? ? ?Social History  ? ?Socioeconomic History  ? Marital status: Single  ?  Spouse name: Not on file  ? Number of children: Not on file  ? Years of education: Not on file  ? Highest education level: Not on file  ?Occupational History  ? Occupation: Landscape architect  ?  Employer: [employer]  ?Tobacco Use  ? Smoking status: Former  ?  Types: Cigarettes  ?  Quit date: 2020  ?  Years since quitting: 3.2  ? Smokeless tobacco: Never  ?Vaping Use  ? Vaping Use: Some days  ? Start date: 04/01/2019  ?Substance and Sexual Activity  ? Alcohol use: Yes  ?  Alcohol/week: 1.0 standard drink  ?  Types: 1 Standard drinks or equivalent per week  ?  Comment: 1 month  ? Drug use: Yes  ?  Types: Marijuana  ?  Comment: for anxiety - had medical card in [place]  ? Sexual activity: Yes  ?  Partners: Male  ?  Birth control/protection: None  ?Other Topics Concern  ? Not on file  ?Social History Narrative  ? Not on file  ? ?Social Determinants of Health  ? ?Financial Resource Strain: Not on file  ?Food Insecurity: Not on file  ?Transportation Needs: Not on file  ?Physical Activity: Not on file  ?Stress: Not on file  ?Social Connections: Not on file  ?Intimate Partner Violence: Not on file  ? ? ?ROS ?Review of Systems  ?Constitutional:  Positive for activity change.  ?HENT:  Positive for dental problem.   ?Gastrointestinal:  Positive for nausea.  ?Allergic/Immunologic: Positive for environmental allergies.  ?Neurological:  Positive for dizziness and headaches.  ?Psychiatric/Behavioral:  The patient is nervous/anxious.   ? ?Objective:  ? ?Blood  pressure 101/72, pulse 72, height _0  (1.651 m), weight 130 lb 14.4 oz (59.4 kg), SpO2 100 %.  ? ?Physical Exam ?Constitutional:   ?   General: She is awake.  ?   Appearance: She is well-developed.  ?HENT:  ?   Head: Normocephalic.  ?Eyes:  ?   Conjunctiva/sclera: Conjunctivae normal.  ?Cardiovascular:  ?   Rate and Rhythm: Normal rate and regular rhythm.  ?   Heart sounds: Normal heart sounds.  ?Pulmonary:  ?   Effort: Pulmonary effort is normal.  ?   Breath sounds: Normal breath sounds.  ?Musculoskeletal:  ?   Right lower leg: No edema.  ?   Left lower leg: No edema.  ?Skin: ?   General: Skin is warm.  ?Neurological:  ?   Mental Status: She is alert and oriented to person, place, and time.  ?Psychiatric:     ?   Attention and Perception: Attention normal.     ?   Mood and Affect: Mood normal.     ?   Speech: Speech normal.     ?   Behavior: Behavior is cooperative.  ? ? ?Assessment & Plan:  ? ?Problem List Items Addressed This Visit   ? ?  ? Endocrine  ? DM (diabetes mellitus), type 1 ([place]) - Primary (Chronic)  ?  I agree with suspicion for DM type 1.  ?I will order GAD65/ IA-2/ insulin autoantibody screening, but advised pt at this point may be inaccurate as she has been using insulin for 2 weeks. ?We reviewed the condition as a whole, I reviewed new technology available to manage blood sugars/insulin distribution.  ?Ref to endocrinology. ?Given another freestyle libre 2 sample-- we replaced her current one. I advised she buy some adhesive removal pads online to make removal easier.  ?Confident pt will be able to manage her new condition. Advised her to continue to follow with nutrition therapy.  ? ?rx additional insulin needles ? ?Ref to pharm here for a consult and potential assistance w/ insulin coverage ?  ?  ? Relevant Medications  ? Insulin Pen Needle (PEN NEEDLES) 31G X 5 MM MISC  ? Other Relevant Orders  ? GAD65, IA-2, and Insulin Autoantibody serum  ? Ambulatory referral to Endocrinology  ? AMB  Referral to [place]  ?  ? Other  ? Urinary frequency  ?  Symptoms are resolving, no more episodes of nocturia. ?Given myriad of recent symptoms, ordered tsh//t4 to cover bases ?Likely all attributable to DM dx ?  ?  ?  Relevant Orders  ? TSH + free T4 (Completed)  ? Family history of pancreatic cancer  ?  Discussed fhx in detail ?PGM with pancreatic cancer and breast cancer diagnosis. ?Discussed genetic concerns, ref to Dietitian.  ?  ?  ? Relevant Orders  ? Ambulatory referral to Genetics  ? ?Other Visit Diagnoses   ? ? Family history of breast cancer      ? Relevant Orders  ? Ambulatory referral to Genetics  ? ?  ? ?Return in about 6 months (around 12/22/2021) for diabetes .  ?I, Mikey Kirschner, PA-C have reviewed all documentation for this visit. The documentation on  06/25/2021  for the exam, diagnosis, procedures, and orders are all accurate and complete. ? ?Mikey Kirschner, PA-C ?[facility] ?[address] ?[place], [place], [address] ?Office: [phone] ?Fax: [phone]  ? ?

## 2021-06-25 ENCOUNTER — Encounter: Payer: Self-pay | Admitting: Physician Assistant

## 2021-06-25 DIAGNOSIS — R350 Frequency of micturition: Secondary | ICD-10-CM | POA: Insufficient documentation

## 2021-06-25 DIAGNOSIS — Z800 Family history of malignant neoplasm of digestive organs: Secondary | ICD-10-CM | POA: Insufficient documentation

## 2021-06-25 NOTE — Assessment & Plan Note (Signed)
Symptoms are resolving, no more episodes of nocturia. ?Given myriad of recent symptoms, ordered tsh//t4 to cover bases ?Likely all attributable to DM dx ?

## 2021-06-25 NOTE — Assessment & Plan Note (Addendum)
I agree with suspicion for DM type 1.  ?I will order GAD65/ IA-2/ insulin autoantibody screening, but advised pt at this point may be inaccurate as she has been using insulin for 2 weeks. ?We reviewed the condition as a whole, I reviewed new technology available to manage blood sugars/insulin distribution.  ?Ref to endocrinology. ?Given another freestyle libre 2 sample-- we replaced her current one. I advised she buy some adhesive removal pads online to make removal easier.  ?Confident pt will be able to manage her new condition. Advised her to continue to follow with nutrition therapy.  ? ?rx additional insulin needles ? ?Ref to pharm here for a consult and potential assistance w/ insulin coverage ?

## 2021-06-25 NOTE — Assessment & Plan Note (Signed)
Discussed fhx in detail ?PGM with pancreatic cancer and breast cancer diagnosis. ?Discussed genetic concerns, ref to Dentist.  ?

## 2021-06-26 ENCOUNTER — Telehealth: Payer: Self-pay | Admitting: *Deleted

## 2021-06-26 NOTE — Chronic Care Management (AMB) (Signed)
?  Care Management  ? ?Note ? ?06/26/2021 ?Name: Jaidah Lomax MRN: [id number] DOB: 04-01-1995 ? ?Tennille Montelongo is a 26 y.o. year old female who is a primary care patient of [name], [place]. I reached out to [employer] by phone today offer care coordination services.  ? ?Ms. Pascarella was given information about care management services today including:  ?Care management services include personalized support from designated clinical staff supervised by her physician, including individualized plan of care and coordination with other care providers ?24/7 contact phone numbers for assistance for urgent and routine care needs. ?The patient may stop care management services at any time by phone call to the office staff. ? ?Patient agreed to services and verbal consent obtained.  ? ?Follow up plan: ?Telephone appointment with care management team member scheduled for: 07/08/2021 ? ?Jasman Murri, CCMA ?Care Guide, Embedded Care Coordination ?[facility]  Care Management  ?Direct Dial: [phone] ? ? ?

## 2021-06-30 LAB — TSH+FREE T4
Free T4: 1.21 ng/dL (ref 0.82–1.77)
TSH: 0.715 u[IU]/mL (ref 0.450–4.500)

## 2021-06-30 LAB — GLUTAMIC ACID DECARBOXYLASE AUTO ABS: Glutamic Acid Decarb Ab: <5 U/mL (ref 0.0–5.0)

## 2021-06-30 LAB — IA-2 AUTOANTIBODIES: IA-2 Autoantibodies: <7.5 U/mL

## 2021-07-02 ENCOUNTER — Telehealth: Payer: Self-pay

## 2021-07-02 NOTE — Progress Notes (Signed)
Chronic Care Management APPOINTMENT REMINDER ? ? ?Called Stacy George, No answer, left message of appointment on 07/08/2021 at 1:30 pm via telephone visit with Stacy George , Pharm D. Notified to have all medications, supplements, blood pressure and/or blood sugar logs available during appointment and to return call if need to reschedule. ? ?Everlean Cherry ?Clinical Pharmacist Assistant ?[phone]  ? ? ?

## 2021-07-06 ENCOUNTER — Encounter: Payer: Self-pay | Admitting: Physician Assistant

## 2021-07-08 ENCOUNTER — Telehealth: Payer: BC Managed Care – PPO

## 2021-07-08 NOTE — Progress Notes (Deleted)
? ?Chronic Care Management ?Pharmacy Note ? ?07/08/2021 ?Name:  Stacy George MRN:  [id number] DOB:  1995/09/04 ? ?Summary: ?*** ? ?Recommendations/Changes made from today's visit: ?*** ? ?Plan: ?*** ? ? ?Subjective: ?Stacy George is an 26 y.o. year old female who is a primary patient of Thedore Mins, Ria Comment, [place].  The CCM team was consulted for assistance with disease management and care coordination needs.   ? ?Engaged with patient by telephone for initial visit in response to provider referral for pharmacy case management and/or care coordination services.  ? ?Consent to Services:  ?The patient was given the following information about Chronic Care Management services today, agreed to services, and gave verbal consent: 1. CCM service includes personalized support from designated clinical staff supervised by the primary care provider, including individualized plan of care and coordination with other care providers 2. 24/7 contact phone numbers for assistance for urgent and routine care needs. 3. Service will only be billed when office clinical staff spend 20 minutes or more in a month to coordinate care. 4. Only one practitioner may furnish and bill the service in a calendar month. 5.The patient may stop CCM services at any time (effective at the end of the month) by phone call to the office staff. 6. The patient will be responsible for cost sharing (co-pay) of up to 20% of the service fee (after annual deductible is met). Patient agreed to services and consent obtained. ? ?Patient Care Team: ?Emelia Loron as PCP - General (Physician Assistant) ?Pa, [place] ?Germaine Pomfret, [facility] (Pharmacist) ? ?Recent office visits: ?*** ? ?Recent consult visits: ?*** ? ?Hospital visits: ?{Hospital DC Yes/No:25215} ? ? ?Objective: ? ?Lab Results  ?Component Value Date  ? CREATININE 0.53 06/07/2021  ? BUN 8 06/07/2021  ? EGFR 132 06/03/2021  ? GFRNONAA >60 06/07/2021  ? NA 136 06/07/2021  ? K 5.0  06/07/2021  ? CALCIUM 8.5 (L) 06/07/2021  ? CO2 23 06/07/2021  ? GLUCOSE 196 (H) 06/07/2021  ? ? ?Lab Results  ?Component Value Date/Time  ? HGBA1C 14.1 (H) 05/28/2021 02:04 PM  ?  ?Last diabetic Eye exam: No results found for: HMDIABEYEEXA  ?Last diabetic Foot exam: No results found for: HMDIABFOOTEX  ? ?No results found for: CHOL, HDL, LDLCALC, LDLDIRECT, TRIG, CHOLHDL ? ? ?  Latest Ref Rng & Units 06/06/2021  ?  4:37 PM 06/03/2021  ?  6:33 PM 06/03/2021  ?  3:18 PM  ?Hepatic Function  ?Total Protein 6.5 - 8.1 g/dL 7.4   7.3   7.0    ?Albumin 3.5 - 5.0 g/dL 4.4   4.4   4.7    ?AST 15 - 41 U/L $Remo'14   13   11    'kKAlX$ ?ALT 0 - 44 U/L $Remo'10   11   8    'SBnYp$ ?Alk Phosphatase 38 - 126 U/L 75   71   94    ?Total Bilirubin 0.3 - 1.2 mg/dL 1.1   0.8   0.4    ?Bilirubin, Direct 0.0 - 0.2 mg/dL  <0.1     ? ? ?Lab Results  ?Component Value Date/Time  ? TSH 0.715 06/21/2021 03:06 PM  ? FREET4 1.21 06/21/2021 03:06 PM  ? ? ? ?  Latest Ref Rng & Units 06/06/2021  ?  4:37 PM 06/03/2021  ?  6:33 PM  ?CBC  ?WBC 4.0 - 10.5 K/uL 10.4   12.0    ?Hemoglobin 12.0 - 15.0 g/dL 14.4   13.4    ?  Hematocrit 36.0 - 46.0 % 48.9   40.9    ?Platelets 150 - 400 K/uL 372   336    ? ? ?No results found for: VD25OH ? ?Clinical ASCVD: {YES/NO:21197} ?The ASCVD Risk score (Arnett DK, et al., 2019) failed to calculate for the following reasons: ?  The 2019 ASCVD risk score is only valid for ages 40 to 79   ? ? ?  06/21/2021  ?  1:59 PM 06/20/2021  ?  8:58 AM  ?Depression screen PHQ 2/9  ?Decreased Interest 2 0  ?Down, Depressed, Hopeless 1 0  ?PHQ - 2 Score 3 0  ?Altered sleeping 1   ?Tired, decreased energy 2   ?Change in appetite 2   ?Feeling bad or failure about yourself  0   ?Trouble concentrating 3   ?Moving slowly or fidgety/restless 0   ?Suicidal thoughts 0   ?PHQ-9 Score 11   ?Difficult doing work/chores Somewhat difficult   ?  ? ?***Other: (CHADS2VASc if Afib, MMRC or CAT for COPD, ACT, DEXA) ? ?Social History  ? ?Tobacco Use  ?Smoking Status Former  ? Types: Cigarettes   ? Quit date: 2020  ? Years since quitting: 3.2  ?Smokeless Tobacco Never  ? ?BP Readings from Last 3 Encounters:  ?06/21/21 101/72  ?06/20/21 102/70  ?06/07/21 131/81  ? ?Pulse Readings from Last 3 Encounters:  ?06/21/21 72  ?06/07/21 69  ?06/03/21 77  ? ?Wt Readings from Last 3 Encounters:  ?06/21/21 130 lb 14.4 oz (59.4 kg)  ?06/20/21 134 lb 1.6 oz (60.8 kg)  ?06/06/21 124 lb (56.2 kg)  ? ?BMI Readings from Last 3 Encounters:  ?06/21/21 21.78 kg/m?  ?06/20/21 22.32 kg/m?  ?06/06/21 20.63 kg/m?  ? ? ?Assessment/Interventions: Review of patient past medical history, allergies, medications, health status, including review of consultants reports, laboratory and other test data, was performed as part of comprehensive evaluation and provision of chronic care management services.  ? ?SDOH:  (Social Determinants of Health) assessments and interventions performed: {yes/no:20286} ? ?SDOH Screenings  ? ?Alcohol Screen: Low Risk   ? Last Alcohol Screening Score (AUDIT): 1  ?Depression (PHQ2-9): Medium Risk  ? PHQ-2 Score: 11  ?Financial Resource Strain: Not on file  ?Food Insecurity: Not on file  ?Housing: Not on file  ?Physical Activity: Not on file  ?Social Connections: Not on file  ?Stress: Not on file  ?Tobacco Use: Medium Risk  ? Smoking Tobacco Use: Former  ? Smokeless Tobacco Use: Never  ? Passive Exposure: Not on file  ?Transportation Needs: Not on file  ? ? ?CCM Care Plan ? ?Allergies  ?Allergen Reactions  ? Other   ?  Environmental allergies  ? ? ?Medications Reviewed Today   ? ? Reviewed by Drubel, Lindsay, PA-C (Physician Assistant Certified) on 06/25/21 at 0905  Med List Status: <None>  ? ?Medication Order Taking? Sig Documenting Provider Last Dose Status Informant  ?insulin detemir (LEVEMIR) 100 UNIT/ML FlexPen [id number] Yes Inject 10 Units into the skin at bedtime. Krishnan, Sendil K, MD Taking Active   ?insulin lispro (HUMALOG) 200 UNIT/ML KwikPen [id number] Yes Before meals: 121-150: 1 unit, 151-200: 2 units,  201-250: 3 units, 251-300: 5 units, 301-350: 7 units, 351-400: 9 units      Bedtime: 121-200: 0 units, 201-250: 2 units, 251-300: 3 units, 301-350: 4 units, 351-400: 5 units Krishnan, Sendil K, MD Taking Active   ?         ?Med Note (SHOTWELL, SHEILA M   Thu Jun 20, 2021    9:01 AM) U-100  ?Insulin Pen Needle (PEN NEEDLES) 31G X 5 MM MISC [id number] Yes Check blood sugar 4 times daily, before meals, fasting, and before bed Drubel, Lindsay, PA-C  Active   ? ?  ?  ? ?  ? ? ?Patient Active Problem List  ? Diagnosis Date Noted  ? Urinary frequency 06/25/2021  ? Family history of pancreatic cancer 06/25/2021  ? DKA (diabetic ketoacidosis) (HCC) 06/07/2021  ? DM (diabetes mellitus), type 1 (HCC) 06/06/2021  ? Metabolic acidosis 06/06/2021  ? Anxiety 03/27/2021  ? Migraines 03/27/2021  ? ? ?Immunization History  ?Administered Date(s) Administered  ? Influenza,inj,Quad PF,6+ Mos 03/27/2021  ? ? ?Conditions to be addressed/monitored:  ?{USCCMDZASSESSMENTOPTIONS:23563} ? ?There are no care plans that you recently modified to display for this patient. ?  ? ?Medication Assistance: {MEDASSISTANCEINFO:25044} ? ?Compliance/Adherence/Medication fill history: ?Care Gaps: ?*** ? ?Star-Rating Drugs: ?*** ? ?Patient's preferred pharmacy is: ? ?[id number] - [place], [place] - [address] ?[address] ?[place] [address] ?Phone: [phone] Fax: [phone] ? ?Uses pill box? {Yes or If no, why not?:20788} ?Pt endorses ***% compliance ? ?We discussed: {Pharmacy options:24294} ?Patient decided to: {US Pharmacy Plan:23885} ? ?Care Plan and Follow Up Patient Decision:  {FOLLOWUP:24991} ? ?Plan: {CM FOLLOW UP PLAN:25073} ? ?*** ? ? ? ?

## 2021-07-09 ENCOUNTER — Telehealth: Payer: Self-pay

## 2021-07-09 ENCOUNTER — Telehealth: Payer: BC Managed Care – PPO

## 2021-07-09 NOTE — Progress Notes (Deleted)
? ?Chronic Care Management ?Pharmacy Note ? ?07/09/2021 ?Name:  Stacy George MRN:  [id number] DOB:  March 31, 1996 ? ?Summary: ?*** ? ?Recommendations/Changes made from today's visit: ?*** ? ?Plan: ?*** ? ? ?Subjective: ?Stacy George is an 26 y.o. year old female who is a primary patient of Thedore Mins, Ria Comment, [place].  The CCM team was consulted for assistance with disease management and care coordination needs.   ? ?Engaged with patient by telephone for initial visit in response to provider referral for pharmacy case management and/or care coordination services.  ? ?Consent to Services:  ?The patient was given the following information about Chronic Care Management services today, agreed to services, and gave verbal consent: 1. CCM service includes personalized support from designated clinical staff supervised by the primary care provider, including individualized plan of care and coordination with other care providers 2. 24/7 contact phone numbers for assistance for urgent and routine care needs. 3. Service will only be billed when office clinical staff spend 20 minutes or more in a month to coordinate care. 4. Only one practitioner may furnish and bill the service in a calendar month. 5.The patient may stop CCM services at any time (effective at the end of the month) by phone call to the office staff. 6. The patient will be responsible for cost sharing (co-pay) of up to 20% of the service fee (after annual deductible is met). Patient agreed to services and consent obtained. ? ?Patient Care Team: ?Emelia Loron as PCP - General (Physician Assistant) ?Pa, [place] ?Germaine Pomfret, [facility] (Pharmacist) ? ?Recent office visits: ?*** ? ?Recent consult visits: ?*** ? ?Hospital visits: ?{Hospital DC Yes/No:25215} ? ? ?Objective: ? ?Lab Results  ?Component Value Date  ? CREATININE 0.53 06/07/2021  ? BUN 8 06/07/2021  ? EGFR 132 06/03/2021  ? GFRNONAA >60 06/07/2021  ? NA 136 06/07/2021  ? K 5.0  06/07/2021  ? CALCIUM 8.5 (L) 06/07/2021  ? CO2 23 06/07/2021  ? GLUCOSE 196 (H) 06/07/2021  ? ? ?Lab Results  ?Component Value Date/Time  ? HGBA1C 14.1 (H) 05/28/2021 02:04 PM  ?  ?Last diabetic Eye exam: No results found for: HMDIABEYEEXA  ?Last diabetic Foot exam: No results found for: HMDIABFOOTEX  ? ?No results found for: CHOL, HDL, LDLCALC, LDLDIRECT, TRIG, CHOLHDL ? ? ?  Latest Ref Rng & Units 06/06/2021  ?  4:37 PM 06/03/2021  ?  6:33 PM 06/03/2021  ?  3:18 PM  ?Hepatic Function  ?Total Protein 6.5 - 8.1 g/dL 7.4   7.3   7.0    ?Albumin 3.5 - 5.0 g/dL 4.4   4.4   4.7    ?AST 15 - 41 U/L _0 ?ALT 0 - 44 U/L _1 ?Alk Phosphatase 38 - 126 U/L 75   71   94    ?Total Bilirubin 0.3 - 1.2 mg/dL 1.1   0.8   0.4    ?Bilirubin, Direct 0.0 - 0.2 mg/dL  <0.1     ? ? ?Lab Results  ?Component Value Date/Time  ? TSH 0.715 06/21/2021 03:06 PM  ? FREET4 1.21 06/21/2021 03:06 PM  ? ? ? ?  Latest Ref Rng & Units 06/06/2021  ?  4:37 PM 06/03/2021  ?  6:33 PM  ?CBC  ?WBC 4.0 - 10.5 K/uL 10.4   12.0    ?Hemoglobin 12.0 - 15.0 g/dL 14.4   13.4    ?  Hematocrit 36.0 - 46.0 % 48.9   40.9    ?Platelets 150 - 400 K/uL 372   336    ? ? ?No results found for: VD25OH ? ?Clinical ASCVD: {YES/NO:21197} ?The ASCVD Risk score (Arnett DK, et al., 2019) failed to calculate for the following reasons: ?  The 2019 ASCVD risk score is only valid for ages 40 to 79   ? ? ?  06/21/2021  ?  1:59 PM 06/20/2021  ?  8:58 AM  ?Depression screen PHQ 2/9  ?Decreased Interest 2 0  ?Down, Depressed, Hopeless 1 0  ?PHQ - 2 Score 3 0  ?Altered sleeping 1   ?Tired, decreased energy 2   ?Change in appetite 2   ?Feeling bad or failure about yourself  0   ?Trouble concentrating 3   ?Moving slowly or fidgety/restless 0   ?Suicidal thoughts 0   ?PHQ-9 Score 11   ?Difficult doing work/chores Somewhat difficult   ?  ? ?***Other: (CHADS2VASc if Afib, MMRC or CAT for COPD, ACT, DEXA) ? ?Social History  ? ?Tobacco Use  ?Smoking Status Former  ? Types: Cigarettes   ? Quit date: 2020  ? Years since quitting: 3.2  ?Smokeless Tobacco Never  ? ?BP Readings from Last 3 Encounters:  ?06/21/21 101/72  ?06/20/21 102/70  ?06/07/21 131/81  ? ?Pulse Readings from Last 3 Encounters:  ?06/21/21 72  ?06/07/21 69  ?06/03/21 77  ? ?Wt Readings from Last 3 Encounters:  ?06/21/21 130 lb 14.4 oz (59.4 kg)  ?06/20/21 134 lb 1.6 oz (60.8 kg)  ?06/06/21 124 lb (56.2 kg)  ? ?BMI Readings from Last 3 Encounters:  ?06/21/21 21.78 kg/m?  ?06/20/21 22.32 kg/m?  ?06/06/21 20.63 kg/m?  ? ? ?Assessment/Interventions: Review of patient past medical history, allergies, medications, health status, including review of consultants reports, laboratory and other test data, was performed as part of comprehensive evaluation and provision of chronic care management services.  ? ?SDOH:  (Social Determinants of Health) assessments and interventions performed: {yes/no:20286} ? ?SDOH Screenings  ? ?Alcohol Screen: Low Risk   ? Last Alcohol Screening Score (AUDIT): 1  ?Depression (PHQ2-9): Medium Risk  ? PHQ-2 Score: 11  ?Financial Resource Strain: Not on file  ?Food Insecurity: Not on file  ?Housing: Not on file  ?Physical Activity: Not on file  ?Social Connections: Not on file  ?Stress: Not on file  ?Tobacco Use: Medium Risk  ? Smoking Tobacco Use: Former  ? Smokeless Tobacco Use: Never  ? Passive Exposure: Not on file  ?Transportation Needs: Not on file  ? ? ?CCM Care Plan ? ?Allergies  ?Allergen Reactions  ? Other   ?  Environmental allergies  ? ? ?Medications Reviewed Today   ? ? Reviewed by Drubel, Lindsay, PA-C (Physician Assistant Certified) on 06/25/21 at 0905  Med List Status: <None>  ? ?Medication Order Taking? Sig Documenting Provider Last Dose Status Informant  ?insulin detemir (LEVEMIR) 100 UNIT/ML FlexPen [id number] Yes Inject 10 Units into the skin at bedtime. Krishnan, Sendil K, MD Taking Active   ?insulin lispro (HUMALOG) 200 UNIT/ML KwikPen [id number] Yes Before meals: 121-150: 1 unit, 151-200: 2 units,  201-250: 3 units, 251-300: 5 units, 301-350: 7 units, 351-400: 9 units      Bedtime: 121-200: 0 units, 201-250: 2 units, 251-300: 3 units, 301-350: 4 units, 351-400: 5 units Krishnan, Sendil K, MD Taking Active   ?         ?Med Note (SHOTWELL, SHEILA M   Thu Jun 20, 2021    9:01 AM) U-100  ?Insulin Pen Needle (PEN NEEDLES) 31G X 5 MM MISC [id number] Yes Check blood sugar 4 times daily, before meals, fasting, and before bed Drubel, Lindsay, PA-C  Active   ? ?  ?  ? ?  ? ? ?Patient Active Problem List  ? Diagnosis Date Noted  ? Urinary frequency 06/25/2021  ? Family history of pancreatic cancer 06/25/2021  ? DKA (diabetic ketoacidosis) (HCC) 06/07/2021  ? DM (diabetes mellitus), type 1 (HCC) 06/06/2021  ? Metabolic acidosis 06/06/2021  ? Anxiety 03/27/2021  ? Migraines 03/27/2021  ? ? ?Immunization History  ?Administered Date(s) Administered  ? Influenza,inj,Quad PF,6+ Mos 03/27/2021  ? ? ?Conditions to be addressed/monitored:  ?{USCCMDZASSESSMENTOPTIONS:23563} ? ?There are no care plans that you recently modified to display for this patient. ?  ? ?Medication Assistance: {MEDASSISTANCEINFO:25044} ? ?Compliance/Adherence/Medication fill history: ?Care Gaps: ?*** ? ?Star-Rating Drugs: ?*** ? ?Patient's preferred pharmacy is: ? ?[id number] [facility] - [place], [place] - [address] ?[address] ?  [address] ?Phone: [phone] Fax: [phone] ? ?Uses pill box? {Yes or If no, why not?:20788} ?Pt endorses ***% compliance ? ?We discussed: {Pharmacy options:24294} ?Patient decided to: {US Pharmacy Plan:23885} ? ?Care Plan and Follow Up Patient Decision:  {FOLLOWUP:24991} ? ?Plan: {CM FOLLOW UP PLAN:25073} ? ?*** ? ? ? ?

## 2021-07-09 NOTE — Telephone Encounter (Signed)
Copied from CRM [id number]. Topic: Appointment Scheduling - Scheduling Inquiry for Clinic ?>> Jul 09, 2021  3:12 PM Pawlus, Stacy George wrote: ?Reason for CRM: Pt called in stating she missed her phone appt today 4/11 with FLEURY, ALEXANDRE George, please advise if it can be rescheduled. ?

## 2021-07-11 ENCOUNTER — Telehealth: Payer: Self-pay

## 2021-07-11 NOTE — Telephone Encounter (Signed)
Copied from CRM [id number]. Topic: General - Other ?>> Jul 11, 2021 12:30 PM Randol Kern wrote: ?Reason for CRM: Pt wants a letter from her doctor stating that she can travel with her insulin, please advise. Advised to contact airline for details on what they need from her PCP.  ? ?Best contact: [phone] ?

## 2021-07-15 ENCOUNTER — Encounter: Payer: Self-pay | Admitting: Physician Assistant

## 2021-07-23 ENCOUNTER — Inpatient Hospital Stay: Payer: BC Managed Care – PPO | Attending: Oncology | Admitting: Licensed Clinical Social Worker

## 2021-07-23 ENCOUNTER — Inpatient Hospital Stay: Payer: BC Managed Care – PPO

## 2021-07-23 ENCOUNTER — Encounter: Payer: Self-pay | Admitting: Licensed Clinical Social Worker

## 2021-07-23 DIAGNOSIS — Z803 Family history of malignant neoplasm of breast: Secondary | ICD-10-CM

## 2021-07-23 DIAGNOSIS — Z800 Family history of malignant neoplasm of digestive organs: Secondary | ICD-10-CM

## 2021-07-23 NOTE — Progress Notes (Signed)
REFERRING PROVIDER: ?Mikey Kirschner, PA-C ?[address] ?[place],  [place] [address] ? ?PRIMARY PROVIDER:  ?Mikey Kirschner, PA-C ? ?PRIMARY REASON FOR VISIT:  ?1. Family history of pancreatic cancer   ?2. Family history of breast cancer   ? ? ? ?HISTORY OF PRESENT ILLNESS:   ?Stacy George, a 26 y.o. female, was seen for a [place] cancer genetics consultation at the request of Dr. Thedore Mins due to a family history of pancreatic cancer.  Stacy George presents to clinic today to discuss the possibility of a hereditary predisposition to cancer, genetic testing, and to further clarify her future cancer risks, as well as potential cancer risks for family members.  ? ?Stacy George is a 26 y.o. female with no personal history of cancer.   ? ?CANCER HISTORY:  ?Oncology History  ? No history exists.  ? ? ? ?RISK FACTORS:  ?Menarche was at age 59-10.  ?OCP use for approximately 2 years.  ?Ovaries intact: yes.  ?Hysterectomy: no.  ?Menopausal status: premenopausal.  ?HRT use: 0 years. ?Colonoscopy: no; not examined. ?Mammogram within the last year: no. ?Number of breast biopsies: 0. ?Up to date with pelvic exams: yes. ? ?Past Medical History:  ?Diagnosis Date  ? Anxiety   ? Diabetes mellitus without complication ([place])   ? Migraines   ? Periodontal disease   ? Seasonal allergies   ? ? ?Past Surgical History:  ?Procedure Laterality Date  ? KNEE SURGERY    ? cyst and fluid removed from behind knee cap  ? NASAL POLYP EXCISION    ? periodontal surgery    ? WISDOM TOOTH EXTRACTION    ? ? ?Social History  ? ?Socioeconomic History  ? Marital status: Single  ?  Spouse name: Not on file  ? Number of children: Not on file  ? Years of education: Not on file  ? Highest education level: Not on file  ?Occupational History  ? Occupation: Landscape architect  ?  Employer: [employer]  ?Tobacco Use  ? Smoking status: Former  ?  Types: Cigarettes  ?  Quit date: 2020  ?  Years since quitting: 3.3  ? Smokeless tobacco: Never  ?Vaping Use  ?  Vaping Use: Some days  ? Start date: 04/01/2019  ?Substance and Sexual Activity  ? Alcohol use: Yes  ?  Alcohol/week: 1.0 standard drink  ?  Types: 1 Standard drinks or equivalent per week  ?  Comment: 1 month  ? Drug use: Yes  ?  Types: Marijuana  ?  Comment: for anxiety - had medical card in [place]  ? Sexual activity: Yes  ?  Partners: Male  ?  Birth control/protection: None  ?Other Topics Concern  ? Not on file  ?Social History Narrative  ? Not on file  ? ?Social Determinants of Health  ? ?Financial Resource Strain: Not on file  ?Food Insecurity: Not on file  ?Transportation Needs: Not on file  ?Physical Activity: Not on file  ?Stress: Not on file  ?Social Connections: Not on file  ?  ? ?FAMILY HISTORY:  ?We obtained a detailed, 4-generation family history.  Significant diagnoses are listed below: ?Family History  ?Problem Relation Age of Onset  ? Hypertension Maternal Grandmother   ? Diabetes Maternal Grandfather   ? Diabetes Paternal Grandmother   ? Breast cancer Paternal Grandmother   ?     not sure of age  ? Pancreatic cancer Paternal Grandmother   ? ?Stacy George has 1 maternal half brother, 1 maternal  half sister, no cancers. ? ?Stacy George's mother is living at 63. Patient has 1 maternal aunt, no cancer history. Maternal grandfather died in his 79s-50s of diabetes. Grandmother is living at 78, and her mother passed of leukemia. ? ?Stacy George's father is living at 16. Patient has 1 paternal aunt living in her 41s, no cancer history. Paternal grandfather is living in his 68s. Grandmother had breast and pancreatic cancer and passed under age 77.  ? ?Stacy George is unaware of previous family history of genetic testing for hereditary cancer risks. Patient's maternal ancestors are of unknown descent, and paternal ancestors are of unknown descent. There is no reported Ashkenazi Jewish ancestry. There is no known consanguinity. ? ? ? ?GENETIC COUNSELING ASSESSMENT: Stacy George is a 26 y.o. female with a family  history of breast and pancreatic cancer which is somewhat suggestive of a hereditary cancer syndrome and predisposition to cancer. We, therefore, discussed and recommended the following at today's visit.  ? ?DISCUSSION: We discussed that approximately 10% of breast/pancreatic cancer is hereditary. Most cases of hereditary breast/pancreatic cancer are associated with BRCA1/BRCA2 genes, although there are other genes associated with hereditary cancer as well. Cancers and risks are gene specific. We discussed that testing is beneficial for several reasons including knowing about cancer risks, identifying potential screening and risk-reduction options that may be appropriate, and to understand if other family members could be at risk for cancer and allow them to undergo genetic testing.  ? ?We reviewed the characteristics, features and inheritance patterns of hereditary cancer syndromes. We also discussed genetic testing, including the appropriate family members to test, the process of testing, insurance coverage and turn-around-time for results. We discussed the implications of a negative, positive and/or variant of uncertain significant result. We recommended Stacy George pursue genetic testing for the [facility] CancerNext-Expanded+RNA gene panel.  ? ?Based on Stacy George's family history of cancer, she meets medical criteria for genetic testing. Despite that she meets criteria, she may still have an out of pocket cost. We discussed that if her out of pocket cost for testing is over $100, the laboratory will call and confirm whether she wants to proceed with testing.  If the out of pocket cost of testing is less than $100 she will be billed by the genetic testing laboratory.  ? ?We discussed that some people do not want to undergo genetic testing due to fear of genetic discrimination.  A federal law called the Genetic Information Non-Discrimination Act (GINA) of 2008 helps protect individuals against genetic  discrimination based on their genetic test results.  It impacts both health insurance and employment.  For health insurance, it protects against increased premiums, being kicked off insurance or being forced to take a test in order to be insured.  For employment it protects against hiring, firing and promoting decisions based on genetic test results.  Health status due to a cancer diagnosis is not protected under GINA.  This law does not protect life insurance, disability insurance, or other types of insurance.  ? ? ?PLAN: After considering the risks, benefits, and limitations, Stacy George provided informed consent to pursue genetic testing and the blood sample was sent to [facility] for analysis of the [employer]. Results should be available within approximately 2-3 weeks' time, at which point they will be disclosed by telephone to Stacy George, as will any additional recommendations warranted by these results. Stacy George will receive a summary of her genetic counseling visit and a copy of her results  once available. This information will also be available in Epic.  ? ?Stacy George's questions were answered to her satisfaction today. Our contact information was provided should additional questions or concerns arise. Thank you for the referral and allowing [place] to share in the care of your patient.  ? ?Faith Rogue, MS, LCGC ?Genetic Counselor ?[email or web address] ?Phone: [phone] ? ?The patient was seen for a total of 25 minutes in face-to-face genetic counseling.  Dr. Grayland Ormond was available for discussion regarding this case.  ? ?_______________________________________________________________________ ?For Office Staff:  ?Number of people involved in session: 1 ?Was an Intern/ student involved with case: no ? ?

## 2021-07-24 ENCOUNTER — Ambulatory Visit: Payer: BC Managed Care – PPO | Admitting: Internal Medicine

## 2021-07-29 ENCOUNTER — Encounter: Payer: Self-pay | Admitting: Dietician

## 2021-07-29 ENCOUNTER — Encounter: Payer: BC Managed Care – PPO | Attending: Internal Medicine | Admitting: Dietician

## 2021-07-29 VITALS — Ht 65.0 in | Wt 138.2 lb

## 2021-07-29 DIAGNOSIS — Z713 Dietary counseling and surveillance: Secondary | ICD-10-CM | POA: Insufficient documentation

## 2021-07-29 DIAGNOSIS — E109 Type 1 diabetes mellitus without complications: Secondary | ICD-10-CM | POA: Diagnosis present

## 2021-07-29 NOTE — Progress Notes (Signed)
Diabetes Self-Management Education ? ?Visit Type:  Follow-up ? ?Appt. Start Time: 1430 Appt. End Time: 1530 ? ?07/29/2021 ? ?[name], identified by name and date of birth, is a 26 y.o. female with a diagnosis of Diabetes:  .   ?ASSESSMENT ? ?Height 5\' 5"  (1.651 m), weight 138 lb 3.2 oz (62.7 kg). ?Body mass index is 23 kg/m?. ?  ? Diabetes Self-Management Education - 07/29/21 1452   ? ?  ? Complications  ? Last HgB A1C per patient/outside source 6.9 %   07/26/21  ? How often do you check your blood sugar? > 4 times/day   freestyle Libre time in range 95%, high 4%, low 1%  ? Have you had a dilated eye exam in the past 12 months? Yes   ? Have you had a dental exam in the past 12 months? Yes   06/2021; appt 08/02/21  ? Are you checking your feet? Yes   ? How many days per week are you checking your feet? 7   ?  ? Dietary Intake  ? Breakfast 3 meals and 1 snack daily   ?  ? Activity / Exercise  ? Activity / Exercise Type Light (walking / raking leaves)   walking, weights, restaurant serving job  ? How many days per week do you exercise? 6.5   ? How many minutes per day do you exercise? 60   ? Total minutes per week of exercise 390   ?  ? Patient Education  ? Disease Pathophysiology Definition of diabetes, type 1 and 2, and the diagnosis of diabetes;Factors that contribute to the development of diabetes   ? Healthy Eating Role of diet in the treatment of diabetes and the relationship between the three main macronutrients and blood glucose level;Food label reading, portion sizes and measuring food.;Carbohydrate counting;Meal options for control of blood glucose level and chronic complications.   ? Being Active Role of exercise on diabetes management, blood pressure control and cardiac health.   ? Acute complications Taught prevention, symptoms, and  treatment of hypoglycemia - the 15 rule.   ? Chronic complications Assessed and discussed foot care and prevention of foot problems;Relationship between chronic  complications and blood glucose control   ? Diabetes Stress and Support Role of stress on diabetes   ? ?  ?  ? ?  ? ? ?Learning Objective:  Patient will have a greater understanding of diabetes self-management. ?Patient education plan is to attend individual and/or group sessions per assessed needs and concerns. ? ? ?Plan:  ? ?Patient Instructions  ?Aim for about 45g, at most up to 60g with each meal. Continue to choose mostly whole grains, high fiber/ low glycemic carbs such as beans, peas, fruits, and veggies.  ?Keep up your great healthy habits! ? ? ? ?Expected Outcomes:  Demonstrated interest in learning. Expect positive outcomes ? ?Education material provided: 10/02/21 Acupuncturist); Carbohydrate Counting (sanofi); Diabetes-Friendly Menus and Recipes via MyChart, visit summary with goals via MyChart ? ?If problems or questions, patient to contact team via:  Phone and MyChart message ? ?Future DSME appointment: - 4-6 wks with RN ?

## 2021-07-29 NOTE — Patient Instructions (Signed)
Aim for about 45g, at most up to 60g with each meal. Continue to choose mostly whole grains, high fiber/ low glycemic carbs such as beans, peas, fruits, and veggies.  ?Keep up your great healthy habits! ? ?

## 2021-07-30 ENCOUNTER — Telehealth: Payer: BC Managed Care – PPO

## 2021-07-30 ENCOUNTER — Telehealth: Payer: Self-pay | Admitting: *Deleted

## 2021-07-30 NOTE — Telephone Encounter (Signed)
Copied from [place]. Topic: General - Other >> Jul 30, 2021  2:50 PM Leward Quan A wrote: Reason for CRM: Patient called in asking to speak to [facility] pharmacist did not say what it was in reference to but can be reached at Ph#  [phone]

## 2021-07-30 NOTE — Progress Notes (Deleted)
Chronic Care Management Pharmacy Note  07/30/2021 Name:  Stacy George MRN:  [id number] DOB:  05/22/1995  Summary: ***  Recommendations/Changes made from today's visit: ***  Plan: ***   Subjective: Stacy George is an 26 y.o. year old female who is a primary patient of Stacy George, [place].  The CCM team was consulted for assistance with disease management and care coordination needs.    Engaged with patient by telephone for initial visit in response to provider referral for pharmacy case management and/or care coordination services.   Consent to Services:  The patient was given the following information about Chronic Care Management services today, agreed to services, and gave verbal consent: 1. CCM service includes personalized support from designated clinical staff supervised by the primary care provider, including individualized plan of care and coordination with other care providers 2. 24/7 contact phone numbers for assistance for urgent and routine care needs. 3. Service will only be billed when office clinical staff spend 20 minutes or more in a month to coordinate care. 4. Only one practitioner may furnish and bill the service in a calendar month. 5.The patient may stop CCM services at any time (effective at the end of the month) by phone call to the office staff. 6. The patient will be responsible for cost sharing (co-pay) of up to 20% of the service fee (after annual deductible is met). Patient agreed to services and consent obtained.  Patient Care Team: Stacy Kirschner, PA-C as PCP - General (Physician Assistant) Pa, Stacy George, Glean Salvo, [place] (Pharmacist)  Recent office visits: ***  Recent consult visits: [facility] visits: {Hospital DC Yes/No:25215}   Objective:  Lab Results  Component Value Date   CREATININE 0.53 06/07/2021   BUN 8 06/07/2021   EGFR 132 06/03/2021   GFRNONAA >60 06/07/2021   NA 136 06/07/2021   K 5.0  06/07/2021   CALCIUM 8.5 (L) 06/07/2021   CO2 23 06/07/2021   GLUCOSE 196 (H) 06/07/2021    Lab Results  Component Value Date/Time   HGBA1C 14.1 (H) 05/28/2021 02:04 PM    Last diabetic Eye exam: No results found for: HMDIABEYEEXA  Last diabetic Foot exam: No results found for: HMDIABFOOTEX   No results found for: CHOL, HDL, LDLCALC, LDLDIRECT, TRIG, CHOLHDL     Latest Ref Rng & Units 06/06/2021    4:37 PM 06/03/2021    6:33 PM 06/03/2021    3:18 PM  Hepatic Function  Total Protein 6.5 - 8.1 g/dL 7.4   7.3   7.0    Albumin 3.5 - 5.0 g/dL 4.4   4.4   4.7    AST 15 - 41 U/L _0 ALT 0 - 44 U/L _1 Alk Phosphatase 38 - 126 U/L 75   71   94    Total Bilirubin 0.3 - 1.2 mg/dL 1.1   0.8   0.4    Bilirubin, Direct 0.0 - 0.2 mg/dL  <0.1       Lab Results  Component Value Date/Time   TSH 0.715 06/21/2021 03:06 PM   FREET4 1.21 06/21/2021 03:06 PM       Latest Ref Rng & Units 06/06/2021    4:37 PM 06/03/2021    6:33 PM  CBC  WBC 4.0 - 10.5 K/uL 10.4   12.0    Hemoglobin 12.0 - 15.0 g/dL 14.4   13.4  Hematocrit 36.0 - 46.0 % 48.9   40.9    Platelets 150 - 400 K/uL 372   336      No results found for: VD25OH  Clinical ASCVD: {YES/NO:21197} The ASCVD Risk score (Arnett DK, et al., 2019) failed to calculate for the following reasons:   The 2019 ASCVD risk score is only valid for ages 60 to 74       06/21/2021    1:59 PM 06/20/2021    8:58 AM  Depression screen PHQ 2/9  Decreased Interest 2 0  Down, Depressed, Hopeless 1 0  PHQ - 2 Score 3 0  Altered sleeping 1   Tired, decreased energy 2   Change in appetite 2   Feeling bad or failure about yourself  0   Trouble concentrating 3   Moving slowly or fidgety/restless 0   Suicidal thoughts 0   PHQ-9 Score 11   Difficult doing work/chores Somewhat difficult      ***Other: (CHADS2VASc if Afib, MMRC or CAT for COPD, ACT, DEXA)  Social History   Tobacco Use  Smoking Status Former   Types: Cigarettes    Quit date: 2020   Years since quitting: 3.3  Smokeless Tobacco Never   BP Readings from Last 3 Encounters:  06/21/21 101/72  06/20/21 102/70  06/07/21 131/81   Pulse Readings from Last 3 Encounters:  06/21/21 72  06/07/21 69  06/03/21 77   Wt Readings from Last 3 Encounters:  07/29/21 138 lb 3.2 oz (62.7 kg)  06/21/21 130 lb 14.4 oz (59.4 kg)  06/20/21 134 lb 1.6 oz (60.8 kg)   BMI Readings from Last 3 Encounters:  07/29/21 23.00 kg/m  06/21/21 21.78 kg/m  06/20/21 22.32 kg/m    Assessment/Interventions: Review of patient past medical history, allergies, medications, health status, including review of consultants reports, laboratory and other test data, was performed as part of comprehensive evaluation and provision of chronic care management services.   SDOH:  (Social Determinants of Health) assessments and interventions performed: {yes/no:20286}  SDOH Screenings   Alcohol Screen: Low Risk    Last Alcohol Screening Score (AUDIT): 1  Depression (PHQ2-9): Medium Risk   PHQ-2 Score: 11  Financial Resource Strain: Not on file  Food Insecurity: Not on file  Housing: Not on file  Physical Activity: Not on file  Social Connections: Not on file  Stress: Not on file  Tobacco Use: Medium Risk   Smoking Tobacco Use: Former   Smokeless Tobacco Use: Never   Passive Exposure: Not on file  Transportation Needs: Not on file    [place]  Allergies  Allergen Reactions   Other     Environmental allergies    Medications Reviewed Today     Reviewed by Stacy Kirschner, PA-C (Physician Assistant Certified) on 27/03/50 at [place] List Status: <None>   Medication Order Taking? Sig Documenting Provider Last Dose Status Informant  insulin detemir (LEVEMIR) 100 UNIT/ML FlexPen [id number] Yes Inject 10 Units into the skin at bedtime. Annita Brod, MD Taking Active   insulin lispro (HUMALOG) 200 UNIT/ML KwikPen [id number] Yes Before meals: 121-150: 1 unit, 151-200: 2  units, 201-250: 3 units, 251-300: 5 units, 301-350: 7 units, 351-400: 9 units      Bedtime: 121-200: 0 units, 201-250: 2 units, 251-300: 3 units, 301-350: 4 units, 351-400: 5 units Annita Brod, MD Taking Active            Med Note Lorin Mercy, [facility] M   Thu Jun 20, 2021  9:01 AM) U-100  Insulin Pen Needle (PEN NEEDLES) 31G X 5 MM MISC [id number] Yes Check blood sugar 4 times daily, before meals, fasting, and before bed Stacy Kirschner, PA-C  Active             Patient Active Problem List   Diagnosis Date Noted   Urinary frequency 06/25/2021   Family history of pancreatic cancer 06/25/2021   DKA (diabetic ketoacidosis) ([place]) 06/07/2021   DM (diabetes mellitus), type 1 ([place]) 17/40/8144   Metabolic acidosis 81/85/6314   Anxiety 03/27/2021   Migraines 03/27/2021    Immunization History  Administered Date(s) Administered   Influenza,inj,Quad PF,6+ Mos 03/27/2021    Conditions to be addressed/monitored:  {USCCMDZASSESSMENTOPTIONS:23563}  There are no care plans that you recently modified to display for this patient.    Medication Assistance: {MEDASSISTANCEINFO:25044}  Compliance/Adherence/Medication fill history: Care Gaps: ***  Star-Rating Drugs: ***  Patient's preferred pharmacy is:  [id number] [place], [address] AT [facility] [place] [address] Phone: [phone] Fax: [phone]  Uses pill box? {Yes or If no, why not?:20788} Pt endorses ***% compliance  We discussed: {Pharmacy options:24294} Patient decided to: {US Pharmacy Plan:23885}  Care Plan and Follow Up Patient Decision:  {FOLLOWUP:24991}  Plan: {CM FOLLOW UP PLAN:25073}  ***

## 2021-08-01 ENCOUNTER — Ambulatory Visit: Payer: BC Managed Care – PPO | Admitting: Physician Assistant

## 2021-08-01 NOTE — Progress Notes (Signed)
?  ? ?I,Stacy George,acting as a Neurosurgeon for [employer], PA-C.,have documented all relevant documentation on the behalf of Stacy Ferguson, PA-C,as directed by  Stacy Ferguson, PA-C while in the presence of Stacy Ferguson, PA-C. ? ? ?Established patient visit ? ? ?Patient: Stacy George   DOB: August 29, 1995   26 y.o. Female  MRN: [id number] ?Visit Date: 08/02/2021 ? ?Today's healthcare provider: [name], PA-C  ? ?No chief complaint on file. ? ?Subjective  ?  ?HPI  ?Anxiety, Follow-up ? ?She feels her anxiety is severe and Worse since last visit. ? ?Symptoms: ?No chest pain Yes difficulty concentrating  ?No dizziness Yes fatigue  ?No feelings of losing control Yes insomnia  ?No irritable No palpitations  ?Yes panic attacks Yes racing thoughts  ?No shortness of breath Yes sweating  ?Yes tremors/shakes   ? ?GAD-7 Results ? ?  08/02/2021  ?  8:17 AM 06/21/2021  ?  2:04 PM  ?GAD-7 Generalized Anxiety Disorder Screening Tool  ?1. Feeling Nervous, Anxious, or on Edge 3 3  ?2. Not Being Able to Stop or Control Worrying 0 2  ?3. Worrying Too Much About Different Things 3 2  ?4. Trouble Relaxing 2 2  ?5. Being So Restless it's Hard To Sit Still 3 2  ?6. Becoming Easily Annoyed or Irritable 2 3  ?7. Feeling Afraid As If Something Awful Might Happen 2 0  ?Total GAD-7 Score 15 14  ?Difficulty At Work, Home, or Getting  Along With Others? Not difficult at all Not difficult at all  ? ? ?PHQ-9 Scores ? ?  08/02/2021  ?  8:18 AM 06/21/2021  ?  1:59 PM 06/20/2021  ?  8:58 AM  ?PHQ9 SCORE ONLY  ?PHQ-9 Total Score 15 11 0  ? ? ?---------------------------------------------------------------------------------------------------  ? ?Medications: ?Outpatient Medications Prior to Visit  ?Medication Sig  ? insulin detemir (LEVEMIR) 100 UNIT/ML FlexPen Inject 10 Units into the skin at bedtime.  ? insulin lispro (HUMALOG) 200 UNIT/ML KwikPen Before meals: 121-150: 1 unit, 151-200: 2 units, 201-250: 3 units, 251-300: 5 units, 301-350: 7  units, 351-400: 9 units      Bedtime: 121-200: 0 units, 201-250: 2 units, 251-300: 3 units, 301-350: 4 units, 351-400: 5 units  ? Insulin Pen Needle (PEN NEEDLES) 31G X 5 MM MISC Check blood sugar 4 times daily, before meals, fasting, and before bed  ? SODIUM FLUORIDE 5000 PPM 1.1 % PSTE SMARTSIG:sparingly By Mouth Every Night  ? ?No facility-administered medications prior to visit.  ? ? ?Review of Systems  ?Constitutional:  Negative for fatigue and fever.  ?Respiratory:  Negative for cough and shortness of breath.   ?Cardiovascular:  Negative for chest pain and leg swelling.  ?Gastrointestinal:  Negative for abdominal pain.  ?Neurological:  Negative for dizziness and headaches.  ?Psychiatric/Behavioral:  The patient is nervous/anxious.   ? ?  Objective  ?  ?Blood pressure 108/80, pulse 71, temperature 98.2 ?F (36.8 ?C), height 5\' 5"  (1.651 m), weight 138 lb 1.6 oz (62.6 kg), SpO2 100 %.  ? ?Physical Exam ?Vitals reviewed.  ?Constitutional:   ?   Appearance: She is not ill-appearing.  ?HENT:  ?   Head: Normocephalic.  ?Eyes:  ?   Conjunctiva/sclera: Conjunctivae normal.  ?Cardiovascular:  ?   Rate and Rhythm: Normal rate.  ?Pulmonary:  ?   Effort: Pulmonary effort is normal. No respiratory distress.  ?Neurological:  ?   General: No focal deficit present.  ?   Mental Status: She is alert and oriented  to person, place, and time.  ?Psychiatric:     ?   Mood and Affect: Mood normal.     ?   Behavior: Behavior normal.  ?  ? ? ?No results found for any visits on 08/02/21. ? Assessment & Plan  ?  ? ?Problem List Items Addressed This Visit   ? ?  ? Other  ? GAD (generalized anxiety disorder) - Primary  ?  Discussed therapy, medication ?Pt has been in therapy and taken anxiety medication before ?Discussed a few different therapy options-- if she needs a referral she will message ?Discussed medication options-- settled on prozac 10 mg  ?Advised side effects ?F/u 6-8 weeks ? ?  ?  ? Relevant Medications  ? FLUoxetine (PROZAC)  10 MG capsule  ?  ?Return in about 6 weeks (around 09/13/2021) for anxiety.  ?   ? ? ?I, Stacy Ferguson, PA-C have reviewed all documentation for this visit. The documentation on  08/02/2021 for the exam, diagnosis, procedures, and orders are all accurate and complete. ? ?Stacy Ferguson, PA-C ?[facility] ?[address] ?[place], [place], [address] ?Office: [phone] ?Fax: [phone]  ? ?

## 2021-08-02 ENCOUNTER — Ambulatory Visit (INDEPENDENT_AMBULATORY_CARE_PROVIDER_SITE_OTHER): Payer: BC Managed Care – PPO | Admitting: Physician Assistant

## 2021-08-02 ENCOUNTER — Telehealth: Payer: Self-pay

## 2021-08-02 ENCOUNTER — Encounter: Payer: Self-pay | Admitting: Physician Assistant

## 2021-08-02 VITALS — BP 108/80 | HR 71 | Temp 98.2°F | Ht 65.0 in | Wt 138.1 lb

## 2021-08-02 DIAGNOSIS — F411 Generalized anxiety disorder: Secondary | ICD-10-CM | POA: Diagnosis not present

## 2021-08-02 MED ORDER — FLUOXETINE HCL 10 MG PO CAPS: 10.0000 mg | ORAL_CAPSULE | Freq: Every day | ORAL | 1 refills | Status: DC

## 2021-08-02 NOTE — Progress Notes (Signed)
Per Clinical Pharmacist, I have been unable to connect with this patient for a visit. Can you try seeing if you can reach her and see if she is having any affordability concerns with her insulin? She has Mining engineer so we could try seeing if she qualifies for a coupon card. ? ?I have attempted without success to contact this patient by three time per clinical pharmacist request. I left voice messages for patient to return my call with my contact information include. ? ?Anderson Malta ?Clinical Pharmacist Assistant ?[phone]  ? ?

## 2021-08-02 NOTE — Patient Instructions (Addendum)
[email or web address] ?Email: [email or web address] ? ?Phone: [phone] ?

## 2021-08-02 NOTE — Assessment & Plan Note (Signed)
Discussed therapy, medication ?Pt has been in therapy and taken anxiety medication before ?Discussed a few different therapy options-- if she needs a referral she will message ?Discussed medication options-- settled on prozac 10 mg  ?Advised side effects ?F/u 6-8 weeks ?

## 2021-08-06 ENCOUNTER — Encounter: Payer: Self-pay | Admitting: Licensed Clinical Social Worker

## 2021-08-06 ENCOUNTER — Telehealth: Payer: Self-pay | Admitting: Licensed Clinical Social Worker

## 2021-08-06 ENCOUNTER — Ambulatory Visit: Payer: Self-pay | Admitting: Licensed Clinical Social Worker

## 2021-08-06 DIAGNOSIS — Z1379 Encounter for other screening for genetic and chromosomal anomalies: Secondary | ICD-10-CM

## 2021-08-06 NOTE — Progress Notes (Signed)
HPI:  Stacy George was previously seen in the [facility] clinic due to a family history of cancer and concerns regarding a hereditary predisposition to cancer. Please refer to our prior cancer genetics clinic note for more information regarding our discussion, assessment and recommendations, at the time. Stacy George recent genetic test results were disclosed to her, as were recommendations warranted by these results. These results and recommendations are discussed in more detail below. ? ?CANCER HISTORY:  ?Oncology History  ? No history exists.  ? ? ?FAMILY HISTORY:  ?We obtained a detailed, 4-generation family history.  Significant diagnoses are listed below: ?Family History  ?Problem Relation Age of Onset  ? Hypertension Maternal Grandmother   ? Diabetes Maternal Grandfather   ? Diabetes Paternal Grandmother   ? Breast cancer Paternal Grandmother   ?     not sure of age  ? Pancreatic cancer Paternal Grandmother   ? ? ? ? ?GENETIC TEST RESULTS: Genetic testing reported out on 08/06/2021 through the [facility] CancerNext-Expanded+RNA cancer panel found no pathogenic mutations.  ? ?The CancerNext-Expanded + RNAinsight gene panel offered by [facility] and includes sequencing and rearrangement analysis for the following 77 genes: IP, ALK, APC*, ATM*, AXIN2, BAP1, BARD1, BLM, BMPR1A, BRCA1*, BRCA2*, BRIP1*, CDC73, CDH1*,CDK4, CDKN1B, CDKN2A, CHEK2*, CTNNA1, DICER1, FANCC, FH, FLCN, GALNT12, KIF1B, LZTR1, MAX, MEN1, MET, MLH1*, MSH2*, MSH3, MSH6*, MUTYH*, NBN, NF1*, NF2, NTHL1, PALB2*, PHOX2B, PMS2*, POT1, PRKAR1A, PTCH1, PTEN*, RAD51C*, RAD51D*,RB1, RECQL, RET, SDHA, SDHAF2, SDHB, SDHC, SDHD, SMAD4, SMARCA4, SMARCB1, SMARCE1, STK11, SUFU, TMEM127, TP53*,TSC1, TSC2, VHL and XRCC2 (sequencing and deletion/duplication); EGFR, EGLN1, HOXB13, KIT, MITF, PDGFRA, POLD1 and POLE (sequencing only); EPCAM and GREM1 (deletion/duplication only). ? ?The test report has been scanned into EPIC and is located under the  Molecular Pathology section of the Results Review tab.  A portion of the result report is included below for reference.  ? ? ? ?We discussed that because current genetic testing is not perfect, it is possible there may be a gene mutation in one of these genes that current testing cannot detect, but that chance is small.  There could be another gene that has not yet been discovered, or that we have not yet tested, that is responsible for the cancer diagnoses in the family. It is also possible there is a hereditary cause for the cancer in the family that Stacy George did not inherit and therefore was not identified in her testing.  Therefore, it is important to remain in touch with cancer genetics in the future so that we can continue to offer Stacy George the most up to date genetic testing.  ? ?ADDITIONAL GENETIC TESTING: We discussed with Stacy George that her genetic testing was fairly extensive.  If there are genes identified to increase cancer risk that can be analyzed in the future, we would be happy to discuss and coordinate this testing at that time.   ? ?CANCER SCREENING RECOMMENDATIONS: Stacy George test result is considered negative (normal).  This means that we have not identified a hereditary cause for her  family history of cancer at this time.  ? ?While reassuring, this does not definitively rule out a hereditary predisposition to cancer. It is still possible that there could be genetic mutations that are undetectable by current technology. There could be genetic mutations in genes that have not been tested or identified to increase cancer risk.  Therefore, it is recommended she continue to follow the cancer management and screening guidelines provided by her primary  healthcare provider.  ? ?An individual's cancer risk and medical management are not determined by genetic test results alone. Overall cancer risk assessment incorporates additional factors, including personal medical history, family  history, and any available genetic information that may result in a personalized plan for cancer prevention and surveillance. ? ?RECOMMENDATIONS FOR FAMILY MEMBERS:  Relatives in this family might be at some increased risk of developing cancer, over the general population risk, simply due to the family history of cancer.  We recommended female relatives in this family have a yearly mammogram beginning at age 14, or 15 years younger than the earliest onset of cancer, an annual clinical breast exam, and perform monthly breast self-exams. Female relatives in this family should also have a gynecological exam as recommended by their primary provider.  All family members should be referred for colonoscopy starting at age 42.  ? ? It is also possible there is a hereditary cause for the cancer in Stacy George family that she did not inherit and therefore was not identified in her.  Based on Stacy George family history, we recommended her father and paternal aunt have genetic counseling and testing. Stacy George will let us know if we can be of any assistance in coordinating genetic counseling and/or testing for these family members. ? ?FOLLOW-UP: Lastly, we discussed with Stacy George that cancer genetics is a rapidly advancing field and it is possible that new genetic tests will be appropriate for her and/or her family members in the future. We encouraged her to remain in contact with cancer genetics on an annual basis so we can update her personal and family histories and let her know of advances in cancer genetics that may benefit this family.  ? ?Our contact number was provided. Stacy George's questions were answered to her satisfaction, and she knows she is welcome to call us at anytime with additional questions or concerns.  ? ?Faith Rogue, MS, LCGC ?Genetic Counselor ?[email or web address] ?Phone: [phone] ? ?

## 2021-08-06 NOTE — Telephone Encounter (Signed)
Revealed negative genetic testing.  This normal result is reassuring.  It is unlikely that there is an increased risk of cancer due to a mutation in one of these genes.  However, genetic testing is not perfect, and cannot definitively rule out a hereditary cause.  It will be important for her to keep in contact with genetics to learn if any additional testing may be needed in the future.      

## 2021-08-08 ENCOUNTER — Encounter: Payer: Self-pay | Admitting: Physician Assistant

## 2021-08-09 MED ORDER — FREESTYLE LIBRE 2 SENSOR MISC: 1 refills | Status: DC

## 2021-08-14 ENCOUNTER — Other Ambulatory Visit: Payer: Self-pay | Admitting: Physician Assistant

## 2021-08-14 DIAGNOSIS — E1065 Type 1 diabetes mellitus with hyperglycemia: Secondary | ICD-10-CM

## 2021-08-14 MED ORDER — INSULIN DETEMIR 100 UNIT/ML FLEXPEN: 10.0000 [IU] | PEN_INJECTOR | Freq: Every day | SUBCUTANEOUS | 1 refills | Status: DC

## 2021-08-14 MED ORDER — FREESTYLE LIBRE 2 SENSOR MISC: 5 refills | Status: DC

## 2021-08-14 MED ORDER — INSULIN DETEMIR 100 UNIT/ML FLEXPEN: 10.0000 [IU] | PEN_INJECTOR | Freq: Every day | SUBCUTANEOUS | 2 refills | Status: DC

## 2021-08-20 ENCOUNTER — Other Ambulatory Visit: Payer: Self-pay | Admitting: Physician Assistant

## 2021-08-20 NOTE — Telephone Encounter (Signed)
Medication Refill - Medication: insulin lispro (HUMALOG) 200 UNIT/ML KwikPen  90 day supply renewal   Has the patient contacted their pharmacy? Yes.   (Agent: If no, request that the patient contact the pharmacy for the refill. If patient does not wish to contact the pharmacy document the reason why and proceed with request.) (Agent: If yes, when and what did the pharmacy advise?)  Preferred Pharmacy (with phone number or street name):  [facility] - [place], [place] - [address]  [address] [place] [address]  Phone: [phone] Fax: [phone]   Phone: [phone]  Reference: [id number]  Has the patient been seen for an appointment in the last year OR does the patient have an upcoming appointment? Yes.    Agent: Please be advised that RX refills may take up to 3 business days. We ask that you follow-up with your pharmacy.

## 2021-08-21 NOTE — Telephone Encounter (Signed)
Requested medication (s) are due for refill today: yes  Requested medication (s) are on the active medication list: yes  Last refill:  06/08/21  Future visit scheduled: yes  Notes to clinic:  Unable to refill per protocol, last refill by another provider.      Requested Prescriptions  Pending Prescriptions Disp Refills   insulin lispro (HUMALOG) 200 UNIT/ML KwikPen 3 mL 11    Sig: Before meals: 121-150: 1 unit, 151-200: 2 units, 201-250: 3 units, 251-300: 5 units, 301-350: 7 units, 351-400: 9 units      Bedtime: 121-200: 0 units, 201-250: 2 units, 251-300: 3 units, 301-350: 4 units, 351-400: 5 units     Endocrinology:  Diabetes - Insulins Failed - 08/21/2021  9:42 AM      Failed - HBA1C is between 0 and 7.9 and within 180 days    Hgb A1c MFr Bld  Date Value Ref Range Status  05/28/2021 14.1 (H) 4.8 - 5.6 % Final    Comment:    **Verified by repeat analysis**          Prediabetes: 5.7 - 6.4          Diabetes: >6.4          Glycemic control for adults with diabetes: <7.0          Passed - Valid encounter within last 6 months    Recent Outpatient Visits           2 weeks ago GAD (generalized anxiety disorder)   [employer], [place], PA-C   2 months ago Type 1 diabetes mellitus with hyperglycemia [facility])   [facility] Mikey Kirschner, PA-C       Future Appointments             In 1 month Thedore Mins, Ria Comment, PA-C [facility], [place]   In 1 month Drubel, Ria Comment, [place], [place]   In 7 months Norberto Sorenson, Ian Bushman, CNM [facility]

## 2021-08-23 ENCOUNTER — Telehealth: Payer: Self-pay

## 2021-08-23 ENCOUNTER — Telehealth: Payer: BC Managed Care – PPO

## 2021-08-23 MED ORDER — INSULIN LISPRO 200 UNIT/ML ~~LOC~~ SOPN: PEN_INJECTOR | SUBCUTANEOUS | 11 refills | Status: DC

## 2021-08-23 NOTE — Telephone Encounter (Signed)
Spoke with patient for assistance with medication costs. She denies challenges with affordability of her medications other than Humalog which needs a refill sent to [facility].    Angelena Sole, PharmD, P[place]y [place], CPP  Clinical Pharmacist Practitioner  [facility] [phone]

## 2021-09-02 ENCOUNTER — Ambulatory Visit: Payer: BC Managed Care – PPO | Admitting: *Deleted

## 2021-09-02 ENCOUNTER — Telehealth: Payer: Self-pay | Admitting: *Deleted

## 2021-09-02 NOTE — Telephone Encounter (Signed)
Patient didn't show for her appointment. Left message on voice mail to call back and reschedule.

## 2021-09-09 NOTE — Progress Notes (Signed)
I,[name],acting as a Neurosurgeon for [employer], PA-C.,have documented all relevant documentation on the behalf of [name], PA-C,as directed by  [name], PA-C while in the presence of [name], PA-C.   Established patient visit   Patient: Stacy George   DOB: 1995-07-25   25 y.o. Female  MRN: [id number] Visit Date: 09/10/2021  Today's healthcare provider: [name], PA-C   Cc. Right foot pain 2-3 weeks; anxiety f/u  Subjective    HPI  Brandi is a 26 y/o female who presents today with right foot pain/numbness x 2-3 weeks. She reports it feels like she stepped on something. Initially noticed some redness at the area but denies wound/injury. Wants to be cautious d/t her diabetes. Denies swelling, change in ROM.  Anxiety, Follow-up  She was last seen for anxiety 6 weeks ago. Changes made at last visit include start prozac 10 mg.   She reports excellent compliance with treatment. She reports excellent tolerance of treatment. She is not having side effects.   She feels her anxiety is moderate and Improved since last visit. Feel comfortable at current dose. [place]  Symptoms: No chest pain Yes difficulty concentrating  No dizziness Yes fatigue  No feelings of losing control Yes insomnia  Yes irritable No palpitations  No panic attacks Yes racing thoughts  No shortness of breath Yes sweating  No tremors/shakes    GAD-7 Results    09/10/2021   10:00 AM 08/02/2021    8:17 AM 06/21/2021    2:04 PM  GAD-7 Generalized Anxiety Disorder Screening Tool  1. Feeling Nervous, Anxious, or on Edge 2 3 3   2. Not Being Able to Stop or Control Worrying 2 0 2  3. Worrying Too Much About Different Things 2 3 2   4. Trouble Relaxing 2 2 2   5. Being So Restless it's Hard To Sit Still 2 3 2   6. Becoming Easily Annoyed or Irritable 2 2 3   7. Feeling Afraid As If Something Awful Might Happen 2 2 0  Total GAD-7 Score 14 15 14    Difficulty At Work, Home, or Getting  Along With Others? Very difficult Not difficult at all Not difficult at all    PHQ-9 Scores    08/02/2021    8:18 AM 06/21/2021    1:59 PM 06/20/2021    8:58 AM  PHQ9 SCORE ONLY  PHQ-9 Total Score 15 11 0    ---------------------------------------------------------------------------------------------------   Medications: Outpatient Medications Prior to Visit  Medication Sig   Continuous Blood Gluc Sensor (FREESTYLE LIBRE 2 SENSOR) MISC Apply device according to manufacturers instructions for continuous blood glucose monitoring.   FLUoxetine (PROZAC) 10 MG capsule Take 1 capsule (10 mg total) by mouth daily.   insulin detemir (LEVEMIR) 100 UNIT/ML FlexPen Inject 10 Units into the skin at bedtime.   insulin lispro (HUMALOG) 200 UNIT/ML KwikPen Before meals: 121-150: 1 unit, 151-200: 2 units, 201-250: 3 units, 251-300: 5 units, 301-350: 7 units, 351-400: 9 units      Bedtime: 121-200: 0 units, 201-250: 2 units, 251-300: 3 units, 301-350: 4 units, 351-400: 5 units   Insulin Pen Needle (PEN NEEDLES) 31G X 5 MM MISC Check blood sugar 4 times daily, before meals, fasting, and before bed   SODIUM FLUORIDE 5000 PPM 1.1 % PSTE SMARTSIG:sparingly By Mouth Every Night   No facility-administered medications prior to visit.    Review of Systems  Constitutional:  Negative for fatigue and fever.  Respiratory:  Negative for cough and shortness of breath.   Cardiovascular:  Negative for chest pain and leg swelling.  Gastrointestinal:  Negative for abdominal pain.  Musculoskeletal:  Positive for myalgias.  Neurological:  Negative for dizziness and headaches.      Objective    Blood pressure 103/75, pulse 70, height 5\' 5"  (1.651 m), weight 137 lb 11.2 oz (62.5 kg), last menstrual period 08/28/2021, SpO2 100 %.   Physical Exam Vitals reviewed.  Constitutional:      Appearance: She is not ill-appearing.  HENT:     Head: Normocephalic.  Eyes:      Conjunctiva/sclera: Conjunctivae normal.  Cardiovascular:     Rate and Rhythm: Normal rate.     Pulses:          Dorsalis pedis pulses are 3+ on the right side.  Pulmonary:     Effort: Pulmonary effort is normal. No respiratory distress.  Feet:     Right foot:     Protective Sensation: 3 sites tested.  3 sites sensed.     Skin integrity: Skin integrity normal.     Toenail Condition: Right toenails are normal.     Comments: Right base of big toe w/ slightly diminished sensation compared to other sites. No visible entry wounds, lesions, warts. Neurological:     General: No focal deficit present.     Mental Status: She is alert and oriented to person, place, and time.  Psychiatric:        Mood and Affect: Mood normal.        Behavior: Behavior normal.      No results found for any visits on 09/10/21.  Assessment & Plan     Problem List Items Addressed This Visit       Other   GAD (generalized anxiety disorder)    Fells well and stable on prozac 10 mg, continue.        Right foot pain - Primary    Foreign body vs peripheral neuropathy D/t pinpoint nature of pain, pain w/ pressure, will order foot xray to look for foreign body. Advised pt not all show on xrays -- if xray negative, can soak foot in epsom salt bath, warm compress to see if she can express anything If pain persists w/o foreign body advise f/u with endocrinologist      Relevant Orders   DG Foot Complete Right     Return in about 6 months (around 03/12/2022) for chronic conditions.      I, 03/14/2022, PA-C have reviewed all documentation for this visit. The documentation on  09/10/2021  for the exam, diagnosis, procedures, and orders are all accurate and complete.  09/12/2021, PA-C [facility] [address] [place], [place], [place] Office: [phone] Fax: [phone]   [facility]

## 2021-09-10 ENCOUNTER — Ambulatory Visit
Admission: RE | Admit: 2021-09-10 | Discharge: 2021-09-10 | Disposition: A | Discharge status: Home / Self Care | Payer: BC Managed Care – PPO | Attending: Physician Assistant | Admitting: Physician Assistant

## 2021-09-10 ENCOUNTER — Ambulatory Visit (INDEPENDENT_AMBULATORY_CARE_PROVIDER_SITE_OTHER): Payer: BC Managed Care – PPO | Admitting: Physician Assistant

## 2021-09-10 ENCOUNTER — Ambulatory Visit
Admission: RE | Admit: 2021-09-10 | Discharge: 2021-09-10 | Disposition: A | Discharge status: Home / Self Care | Payer: BC Managed Care – PPO | Source: Ambulatory Visit | Attending: Physician Assistant | Admitting: Physician Assistant

## 2021-09-10 ENCOUNTER — Encounter: Payer: Self-pay | Admitting: Physician Assistant

## 2021-09-10 VITALS — BP 103/75 | HR 70 | Ht 65.0 in | Wt 137.7 lb

## 2021-09-10 DIAGNOSIS — M79671 Pain in right foot: Secondary | ICD-10-CM

## 2021-09-10 DIAGNOSIS — F411 Generalized anxiety disorder: Secondary | ICD-10-CM | POA: Diagnosis not present

## 2021-09-10 NOTE — Assessment & Plan Note (Signed)
Fells well and stable on prozac 10 mg, continue.

## 2021-09-10 NOTE — Assessment & Plan Note (Signed)
Foreign body vs peripheral neuropathy D/t pinpoint nature of pain, pain w/ pressure, will order foot xray to look for foreign body. Advised pt not all show on xrays -- if xray negative, can soak foot in epsom salt bath, warm compress to see if she can express anything If pain persists w/o foreign body advise f/u with endocrinologist

## 2021-09-11 ENCOUNTER — Encounter: Payer: Self-pay | Admitting: Physician Assistant

## 2021-09-18 ENCOUNTER — Emergency Department: Payer: BC Managed Care – PPO

## 2021-09-18 ENCOUNTER — Emergency Department
Admission: EM | Admit: 2021-09-18 | Discharge: 2021-09-18 | Disposition: A | Discharge status: Home / Self Care | Payer: BC Managed Care – PPO | Attending: Emergency Medicine | Admitting: Emergency Medicine

## 2021-09-18 ENCOUNTER — Other Ambulatory Visit: Payer: Self-pay

## 2021-09-18 ENCOUNTER — Encounter: Payer: Self-pay | Admitting: Emergency Medicine

## 2021-09-18 DIAGNOSIS — K529 Noninfective gastroenteritis and colitis, unspecified: Secondary | ICD-10-CM | POA: Insufficient documentation

## 2021-09-18 DIAGNOSIS — E109 Type 1 diabetes mellitus without complications: Secondary | ICD-10-CM | POA: Diagnosis not present

## 2021-09-18 DIAGNOSIS — R1084 Generalized abdominal pain: Secondary | ICD-10-CM | POA: Diagnosis present

## 2021-09-18 DIAGNOSIS — R1032 Left lower quadrant pain: Secondary | ICD-10-CM | POA: Diagnosis not present

## 2021-09-18 LAB — CHLAMYDIA/NGC RT PCR (ARMC ONLY)
Chlamydia Tr: NOT DETECTED
N gonorrhoeae: NOT DETECTED

## 2021-09-18 LAB — WET PREP, GENITAL
Clue Cells Wet Prep HPF POC: NONE SEEN
Sperm: NONE SEEN
Trich, Wet Prep: NONE SEEN
WBC, Wet Prep HPF POC: >=10 — AB (ref <10)
Yeast Wet Prep HPF POC: NONE SEEN

## 2021-09-18 LAB — CBC
HCT: 40.2 % (ref 36.0–46.0)
Hemoglobin: 12.9 g/dL (ref 12.0–15.0)
MCH: 29.3 pg (ref 26.0–34.0)
MCHC: 32.1 g/dL (ref 30.0–36.0)
MCV: 91.2 fL (ref 80.0–100.0)
Platelets: 318 10*3/uL (ref 150–400)
RBC: 4.41 MIL/uL (ref 3.87–5.11)
RDW: 11.9 % (ref 11.5–15.5)
WBC: 10 10*3/uL (ref 4.0–10.5)
nRBC: 0 % (ref 0.0–0.2)

## 2021-09-18 LAB — URINALYSIS, ROUTINE W REFLEX MICROSCOPIC
Bilirubin Urine: NEGATIVE
Glucose, UA: NEGATIVE mg/dL
Hgb urine dipstick: NEGATIVE
Ketones, ur: NEGATIVE mg/dL
Leukocytes,Ua: NEGATIVE
Nitrite: NEGATIVE
Protein, ur: 30 mg/dL — AB
Specific Gravity, Urine: 1.027 (ref 1.005–1.030)
Squamous Epithelial / HPF: >50 — ABNORMAL HIGH (ref 0–5)
pH: 5 (ref 5.0–8.0)

## 2021-09-18 LAB — COMPREHENSIVE METABOLIC PANEL
ALT: 10 U/L (ref 0–44)
AST: 15 U/L (ref 15–41)
Albumin: 4.2 g/dL (ref 3.5–5.0)
Alkaline Phosphatase: 49 U/L (ref 38–126)
Anion gap: 6 (ref 5–15)
BUN: 10 mg/dL (ref 6–20)
CO2: 24 mmol/L (ref 22–32)
Calcium: 9.2 mg/dL (ref 8.9–10.3)
Chloride: 106 mmol/L (ref 98–111)
Creatinine, Ser: 0.58 mg/dL (ref 0.44–1.00)
GFR, Estimated: >60 mL/min (ref >60)
Glucose, Bld: 149 mg/dL — ABNORMAL HIGH (ref 70–99)
Potassium: 4.2 mmol/L (ref 3.5–5.1)
Sodium: 136 mmol/L (ref 135–145)
Total Bilirubin: 1.1 mg/dL (ref 0.3–1.2)
Total Protein: 7.4 g/dL (ref 6.5–8.1)

## 2021-09-18 LAB — POC URINE PREG, ED: Preg Test, Ur: NEGATIVE

## 2021-09-18 LAB — LIPASE, BLOOD: Lipase: 24 U/L (ref 11–51)

## 2021-09-18 MED ORDER — ONDANSETRON HCL 4 MG/2ML IJ SOLN
4.0000 mg | Freq: Once | INTRAMUSCULAR | Status: AC
  Administered 2021-09-18: 4 mg via INTRAVENOUS
  Filled 2021-09-18: qty 2

## 2021-09-18 MED ORDER — FENTANYL CITRATE PF 50 MCG/ML IJ SOSY
50.0000 ug | PREFILLED_SYRINGE | Freq: Once | INTRAMUSCULAR | Status: AC
  Administered 2021-09-18: 50 ug via INTRAVENOUS
  Filled 2021-09-18: qty 1

## 2021-09-18 MED ORDER — SODIUM CHLORIDE 0.9 % IV BOLUS
1000.0000 mL | Freq: Once | INTRAVENOUS | Status: AC
  Administered 2021-09-18: 1000 mL via INTRAVENOUS

## 2021-09-18 MED ORDER — DROPERIDOL 2.5 MG/ML IJ SOLN
1.2500 mg | Freq: Once | INTRAMUSCULAR | Status: AC
  Administered 2021-09-18: 1.25 mg via INTRAVENOUS
  Filled 2021-09-18: qty 2

## 2021-09-18 MED ORDER — DICYCLOMINE HCL 10 MG PO CAPS: 10.0000 mg | ORAL_CAPSULE | Freq: Three times a day (TID) | ORAL | 0 refills | Status: DC

## 2021-09-18 MED ORDER — IOHEXOL 300 MG/ML  SOLN
100.0000 mL | Freq: Once | INTRAMUSCULAR | Status: AC | PRN
  Administered 2021-09-18: 100 mL via INTRAVENOUS

## 2021-09-18 MED ORDER — ONDANSETRON 4 MG PO TBDP: 4.0000 mg | ORAL_TABLET | Freq: Three times a day (TID) | ORAL | 0 refills | Status: AC | PRN

## 2021-09-18 NOTE — ED Triage Notes (Addendum)
Pt via POV from home. Pt c/o generalized abd pain, vomiting, and diarrhea for the past week. Pt is Type I Diabetic, states it usually runs high when she is sick but recently it has been WNL. Denies abd surgeries. Denies urinary symptoms. Pt states she has been some white discharge. Pt is A&Ox4 and NAD

## 2021-09-18 NOTE — Discharge Instructions (Signed)
You can take Tylenol 1 g every 8 hours you can try the Bentyl to see if that helps with any abdominal cramping and use the Zofran to help with nausea.  Try to drink plenty of fluids including Gatorade without sugar.  Return to the ER if you develop worsening symptoms or any other concerns   IMPRESSION: 1. Bowel loops are normal in caliber. No evidence of colitis or diverticulitis. Normal appendix.   2.  No evidence of nephrolithiasis or hydronephrosis.   3.  No CT evidence of acute abdominal/pelvic process.

## 2021-09-18 NOTE — ED Provider Notes (Signed)
[facility] Provider Note    Event Date/Time   First MD Initiated Contact with Patient 09/18/21 1049     (approximate)   History   Emesis   HPI  Stacy George is a 26 y.o. female  type 1 diabetic with one week of NVD that is getting worse. This is associated with some weakness as well.. She is having generalized abdominal pain as well. Denies any recent illness. No fever. No sick contacts. No urinary symptoms. Is sexually active. Ate last night. No blood noted in the vomit.   Patient does report having a little bit of vaginal discharge but she reports being with 1 partner who is her fianc that she has been with for 5 years.  She had recent STD testing that was all negative but she was diagnosed with a yeast infection and they found that she was diabetic just a few months ago.  She reports that she has not similar discharge now.  She denies any new sexual partners.  She reports the diarrhea is only about 3 times a day she denies any antibiotic use or recent travel out of the country or recent drinking of [place].   Physical Exam   Triage Vital Signs: ED Triage Vitals  Enc Vitals Group     BP 09/18/21 0952 133/86     Pulse Rate 09/18/21 0952 81     Resp 09/18/21 0952 20     Temp 09/18/21 0952 98 F (36.7 C)     Temp Source 09/18/21 0952 Oral     SpO2 09/18/21 0952 98 %     Weight 09/18/21 0951 137 lb (62.1 kg)     Height 09/18/21 0951 5\' 5"  (1.651 m)     Head Circumference --      Peak Flow --      Pain Score 09/18/21 0951 6     Pain Loc --      Pain Edu? --      Excl. in GC? --     Most recent vital signs: Vitals:   09/18/21 0952  BP: 133/86  Pulse: 81  Resp: 20  Temp: 98 F (36.7 C)  SpO2: 98%     General: Awake, no distress.  CV:  Good peripheral perfusion.  Resp:  Normal effort.  Abd:  No distention.  Other:  Tender in the llq    ED Results / Procedures / Treatments   Labs (all labs ordered are listed, but only abnormal  results are displayed) Labs Reviewed  COMPREHENSIVE METABOLIC PANEL - Abnormal; Notable for the following components:      Result Value   Glucose, Bld 149 (*)    All other components within normal limits  URINALYSIS, ROUTINE W REFLEX MICROSCOPIC - Abnormal; Notable for the following components:   Color, Urine YELLOW (*)    APPearance CLOUDY (*)    Protein, ur 30 (*)    Bacteria, UA RARE (*)    Squamous Epithelial / LPF >50 (*)    All other components within normal limits  LIPASE, BLOOD  CBC  POC URINE PREG, ED     RADIOLOGY I have reviewed the CT personally interpreted no evidence of acute pathology  PROCEDURES:  Critical Care performed: No  Procedures   MEDICATIONS ORDERED IN ED: Medications  sodium chloride 0.9 % bolus 1,000 mL (1,000 mLs Intravenous New Bag/Given 09/18/21 1143)  ondansetron (ZOFRAN) injection 4 mg (4 mg Intravenous Given 09/18/21 1143)  fentaNYL (SUBLIMAZE) injection 50 mcg (  50 mcg Intravenous Given 09/18/21 1228)     IMPRESSION / MDM / ASSESSMENT AND PLAN / ED COURSE  I reviewed the triage vital signs and the nursing notes.   Patient's presentation is most consistent with acute presentation with potential threat to life or bodily function.   Given the lower abdominal pain and concern about possible ovarian cyst, ovarian rupturing, ovarian torsion.  No right lower quadrant pain to suggest appendicitis.  We will proceed with ultrasound.  I considered the possibility of PID but she just recently had STD testing that was all normal and denies any new partners.  Declined pelvic exam but will do self swabs.  Patient given a dose of IV fentanyl IV Zofran to help with symptoms and some IV fluid.  She is got no risk factors to suggest needing stool studies at this time.  Lipase is normal CMP normal CBC normal.  Urine with a lot of squamous cells in it so difficult to interpret.  Pregnancy test negative  1:34 PM reevaluated patient still having a lot of  abdominal discomfort.  Her ultrasound was reassuring.  Has not going on for over a week we discussed CT imaging to further evaluate which she was okay with.  Patient given a small dose of droperidol to help with some pain and nausea  2:53 PM patient feeling much better after medications.  CT imaging reassuring.  Discussed trial of Zofran, Bentyl at home.  Follow-up with GI as needed.  She could have some gastroparesis from her known diabetes or this could just be a GI bug.  She has not had any stooling here to send for testing.  She feels comfortable with discharge home at this time  I discussed the provisional nature of ED diagnosis, the treatment so far, the ongoing plan of care, follow up appointments and return precautions with the patient and any family or support people present. They expressed understanding and agreed with the plan, discharged home.     FINAL CLINICAL IMPRESSION(S) / ED DIAGNOSES   Final diagnoses:  Gastroenteritis  Type 1 diabetes mellitus without complication (HCC)     Rx / DC Orders   ED Discharge Orders          Ordered    ondansetron (ZOFRAN-ODT) 4 MG disintegrating tablet  Every 8 hours PRN        09/18/21 1454    dicyclomine (BENTYL) 10 MG capsule  3 times daily before meals & bedtime        09/18/21 1454             Note:  This document was prepared using Dragon voice recognition software and may include unintentional dictation errors.   Concha Se, MD 09/18/21 1455

## 2021-09-24 ENCOUNTER — Ambulatory Visit: Payer: BC Managed Care – PPO | Admitting: Physician Assistant

## 2021-09-25 ENCOUNTER — Ambulatory Visit: Payer: BC Managed Care – PPO | Admitting: Physician Assistant

## 2021-10-03 ENCOUNTER — Telehealth: Payer: BC Managed Care – PPO | Admitting: Nurse Practitioner

## 2021-10-03 DIAGNOSIS — U071 COVID-19: Secondary | ICD-10-CM

## 2021-10-03 MED ORDER — FLUTICASONE PROPIONATE 50 MCG/ACT NA SUSP: 2.0000 | Freq: Every day | NASAL | 6 refills | Status: AC

## 2021-10-03 MED ORDER — BENZONATATE 100 MG PO CAPS: 100.0000 mg | ORAL_CAPSULE | Freq: Three times a day (TID) | ORAL | 0 refills | Status: DC | PRN

## 2021-10-03 MED ORDER — ALBUTEROL SULFATE HFA 108 (90 BASE) MCG/ACT IN AERS: 2.0000 | INHALATION_SPRAY | Freq: Four times a day (QID) | RESPIRATORY_TRACT | 0 refills | Status: DC | PRN

## 2021-10-03 NOTE — Progress Notes (Signed)
E-Visit  for Positive Covid Test Result  We are sorry you are not feeling well. We are here to help!  You have tested positive for COVID-19, meaning that you were infected with the novel coronavirus and could give the virus to others.  It is vitally important that you stay home so you do not spread it to others.      Please continue isolation at home, for at least 5 days since the start of your symptoms and until you have had 24 hours with no fever (without taking a fever reducer) and with improving of symptoms.  If you have no symptoms but tested positive (or all symptoms resolve after 5 days and you have no fever) you can leave your house but continue to wear a mask around others for an additional 5 days. If you have a fever,continue to stay home until you have had 24 hours of no fever. Most cases improve 5-10 days from onset but we have seen a small number of patients who have gotten worse after the 10 days.  Please be sure to watch for worsening symptoms and remain taking the proper precautions.   Go to the nearest hospital ED for assessment if fever/cough/breathlessness are severe or illness seems like a threat to life.    The following symptoms may appear 2-14 days after exposure: Fever Cough Shortness of breath or difficulty breathing Chills Repeated shaking with chills Muscle pain Headache Sore throat New loss of taste or smell Fatigue Congestion or runny nose Nausea or vomiting Diarrhea  You have been enrolled in MyChart Home Monitoring for COVID-19. Daily you will receive a questionnaire within the MyChart website. Our COVID-19 response team will be monitoring your responses daily.  You can use medication such as prescription cough medication called Tessalon Perles 100 mg. You may take 1-2 capsules every 8 hours as needed for cough,  prescription inhaler called Albuterol MDI 90 mcg /actuation 2 puffs every 4 hours as needed for shortness of breath, wheezing, cough, and  prescription for Fluticasone nasal spray 2 sprays in each nostril one time per day  You may also take acetaminophen (Tylenol) as needed for fever.  HOME CARE: Only take medications as instructed by your medical team. Drink plenty of fluids and get plenty of rest. A steam or ultrasonic humidifier can help if you have congestion.   GET HELP RIGHT AWAY IF YOU HAVE EMERGENCY WARNING SIGNS.  Call 911 or proceed to your closest emergency facility if: You develop worsening high fever. Trouble breathing Bluish lips or face Persistent pain or pressure in the chest New confusion Inability to wake or stay awake You cough up blood. Your symptoms become more severe Inability to hold down food or fluids  This list is not all possible symptoms. Contact your medical provider for any symptoms that are severe or concerning to you.    Your e-visit answers were reviewed by a board certified advanced clinical practitioner to complete your personal care plan.  Depending on the condition, your plan could have included both over the counter or prescription medications.  If there is a problem please reply once you have received a response from your provider.  Your safety is important to [place].  If you have drug allergies check your prescription carefully.    You can use MyChart to ask questions about today's visit, request a non-urgent call back, or ask for a work or school excuse for 24 hours related to this e-Visit. If it has  been greater than 24 hours you will need to follow up with your provider, or enter a new e-Visit to address those concerns. You will get an e-mail in the next two days asking about your experience.  I hope that your e-visit has been valuable and will speed your recovery. Thank you for using e-visits.  I spent approximately 7 minutes reviewing the patient's history, current symptoms and coordinating their plan of care today.    Meds ordered this encounter  Medications   benzonatate  (TESSALON) 100 MG capsule    Sig: Take 1 capsule (100 mg total) by mouth 3 (three) times daily as needed.    Dispense:  30 capsule    Refill:  0   albuterol (VENTOLIN HFA) 108 (90 Base) MCG/ACT inhaler    Sig: Inhale 2 puffs into the lungs every 6 (six) hours as needed for wheezing or shortness of breath.    Dispense:  8 g    Refill:  0   fluticasone (FLONASE) 50 MCG/ACT nasal spray    Sig: Place 2 sprays into both nostrils daily.    Dispense:  16 g    Refill:  6

## 2021-10-04 ENCOUNTER — Encounter: Payer: Self-pay | Admitting: *Deleted

## 2021-11-04 ENCOUNTER — Ambulatory Visit: Payer: Self-pay
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
## 2021-11-05 ENCOUNTER — Encounter: Payer: Self-pay | Admitting: Physician Assistant

## 2021-11-05 ENCOUNTER — Telehealth (INDEPENDENT_AMBULATORY_CARE_PROVIDER_SITE_OTHER): Payer: BC Managed Care – PPO | Admitting: Physician Assistant

## 2021-11-05 DIAGNOSIS — F430 Acute stress reaction: Secondary | ICD-10-CM | POA: Diagnosis not present

## 2021-11-05 DIAGNOSIS — F411 Generalized anxiety disorder: Secondary | ICD-10-CM

## 2021-11-05 MED ORDER — ALPRAZOLAM 0.25 MG PO TABS: 0.2500 mg | ORAL_TABLET | Freq: Two times a day (BID) | ORAL | 0 refills | Status: DC | PRN

## 2021-11-05 NOTE — Assessment & Plan Note (Addendum)
Acutely worsened d/t family circumstances rx xanax 0.25 mg 1-2 tabs mg up to BID for panic attacks/acute anxiety.  Advised she can increase prozac to 20 mg if she feels it is necessary, but this is an acute increase in anxiety, special circumstances. Pt will decide if she stays at 10 mg or increases to 20 mg, I think she would do fine with either Fine w/ covering fmla from the 8/7- 8/22

## 2021-11-21 ENCOUNTER — Encounter: Payer: Self-pay | Admitting: Physician Assistant

## 2021-11-25 NOTE — Telephone Encounter (Signed)
This patient came by today to get a Josephine Igo but nothing was up front for her so I went to the sample closet and we dont have any.   The message was sent to the patient on 11/21/21 that you had one.  Evidently no one looked to see that we had 0.

## 2021-11-26 ENCOUNTER — Other Ambulatory Visit: Payer: Self-pay

## 2021-11-26 DIAGNOSIS — E1065 Type 1 diabetes mellitus with hyperglycemia: Secondary | ICD-10-CM

## 2021-11-26 MED ORDER — FREESTYLE LIBRE 2 SENSOR MISC: 5 refills | Status: DC

## 2021-12-04 ENCOUNTER — Encounter: Payer: Self-pay | Admitting: Physician Assistant

## 2021-12-04 ENCOUNTER — Ambulatory Visit (INDEPENDENT_AMBULATORY_CARE_PROVIDER_SITE_OTHER): Payer: BC Managed Care – PPO | Admitting: Physician Assistant

## 2021-12-04 VITALS — BP 112/74 | HR 65 | Ht 65.0 in | Wt 134.4 lb

## 2021-12-04 DIAGNOSIS — F411 Generalized anxiety disorder: Secondary | ICD-10-CM | POA: Diagnosis not present

## 2021-12-04 DIAGNOSIS — Z23 Encounter for immunization: Secondary | ICD-10-CM | POA: Diagnosis not present

## 2021-12-04 DIAGNOSIS — E109 Type 1 diabetes mellitus without complications: Secondary | ICD-10-CM

## 2021-12-04 MED ORDER — ALPRAZOLAM 0.25 MG PO TABS: 0.2500 mg | ORAL_TABLET | Freq: Two times a day (BID) | ORAL | 0 refills | Status: DC | PRN

## 2021-12-04 MED ORDER — FLUOXETINE HCL 20 MG PO CAPS: 20.0000 mg | ORAL_CAPSULE | Freq: Every day | ORAL | 3 refills | Status: DC

## 2021-12-04 NOTE — Patient Instructions (Signed)
[email or web address]   

## 2021-12-04 NOTE — Progress Notes (Signed)
Stacy George,[name],acting as a Neurosurgeon for [employer], PA-C.,have documented all relevant documentation on the behalf of Stacy Ferguson, PA-C,as directed by  Stacy Ferguson, PA-C while in the presence of Stacy Ferguson, PA-C.  Established patient visit   Patient: Stacy George   DOB: Jul 27, 1995   26 y.o. Female  MRN: [id number] Visit Date: 12/04/2021  Today's healthcare provider: [name], PA-C   Cc. Panic attack  Subjective    HPI  Pt reports a severe panic attack this AM -- chest tightness,feeling SOB, called EMS at work, their w/u was negative and she opted to come to office instead of ED. Reports sugar was in the 140s, their EKG was negative.  She went home, took at xanax and a nap and felt better.   She reports needing intermittent FMLA for work d/t her DM Stacy George, has days where she is low and needs to leave and is out of PTO.   Anxiety, Follow-up   She was last seen for anxiety 4 weeks ago. Changes made at last visit include rx xanax 0.25 mg 1-2 tabs mg up to BID for panic attacks/acute anxiety Advised she can increase prozac to 20 mg if she feels it is necessary.   She reports excellent compliance with treatment. She reports excellent tolerance of treatment. She is not having side effects.   She feels her anxiety is severe and Worse since last visit.  Symptoms: Yes chest pain Yes difficulty concentrating  Yes dizziness Yes fatigue  No feelings of losing control Yes insomnia  Yes irritable No palpitations  Yes panic attacks Yes racing thoughts  Yes shortness of breath Yes sweating  Yes tremors/shakes    GAD-7 Results    12/04/2021    3:03 PM 11/05/2021   10:30 AM 09/10/2021   10:00 AM  GAD-7 Generalized Anxiety Disorder Screening Tool  1. Feeling Nervous, Anxious, or on Edge 3 3 2   2. Not Being Able to Stop or Control Worrying 3 3 2   3. Worrying Too Much About Different Things 3 3 2   4. Trouble Relaxing 3 2 2   5. Being So Restless it's Hard To Sit Still 3  3 2   6. Becoming Easily Annoyed or Irritable 3 3 2   7. Feeling Afraid As If Something Awful Might Happen 2 2 2   Total GAD-7 Score 20 19 14   Difficulty At Work, Home, or Getting  Along With Others? Not difficult at all Somewhat difficult Very difficult    PHQ-9 Scores    12/04/2021    3:04 PM 08/02/2021    8:18 AM 06/21/2021    1:59 PM  PHQ9 SCORE ONLY  PHQ-9 Total Score 20 15 11     ---------------------------------------------------------------------------------------------------   Medications: Outpatient Medications Prior to Visit  Medication Sig   albuterol (VENTOLIN HFA) 108 (90 Base) MCG/ACT inhaler Inhale 2 puffs into the lungs every 6 (six) hours as needed for wheezing or shortness of breath.   Continuous Blood Gluc Sensor (FREESTYLE LIBRE 2 SENSOR) MISC Apply device according to manufacturers instructions for continuous blood glucose monitoring.   fluticasone (FLONASE) 50 MCG/ACT nasal spray Place 2 sprays into both nostrils daily.   insulin detemir (LEVEMIR) 100 UNIT/ML FlexPen Inject 10 Units into the skin at bedtime.   insulin lispro (HUMALOG) 200 UNIT/ML KwikPen Before meals: 121-150: 1 unit, 151-200: 2 units, 201-250: 3 units, 251-300: 5 units, 301-350: 7 units, 351-400: 9 units      Bedtime: 121-200: 0 units, 201-250: 2 units, 251-300: 3 units, 301-350: 4 units,  351-400: 5 units   Insulin Pen Needle (PEN NEEDLES) 31G X 5 MM MISC Check blood sugar 4 times daily, before meals, fasting, and before bed   SODIUM FLUORIDE 5000 PPM 1.1 % PSTE SMARTSIG:sparingly By Mouth Every Night   [DISCONTINUED] ALPRAZolam (XANAX) 0.25 MG tablet Take 1-2 tablets (0.25-0.5 mg total) by mouth 2 (two) times daily as needed for anxiety.   [DISCONTINUED] FLUoxetine (PROZAC) 10 MG capsule Take 1 capsule (10 mg total) by mouth daily.   benzonatate (TESSALON) 100 MG capsule Take 1 capsule (100 mg total) by mouth 3 (three) times daily as needed. (Patient not taking: Reported on 11/05/2021)   dicyclomine  (BENTYL) 10 MG capsule Take 1 capsule (10 mg total) by mouth 4 (four) times daily -  before meals and at bedtime for 5 days.   No facility-administered medications prior to visit.    Review of Systems  Constitutional:  Negative for fatigue and fever.  Respiratory:  Positive for shortness of breath. Negative for cough.   Cardiovascular:  Positive for chest pain. Negative for leg swelling.  Gastrointestinal:  Negative for abdominal pain.  Neurological:  Negative for dizziness and headaches.  Psychiatric/Behavioral:  The patient is nervous/anxious.       Objective    BP 112/74 (BP Location: Left Arm, Patient Position: Sitting, Cuff Size: Normal)   Pulse 65   Ht 5\' 5"  (1.651 m)   Wt 134 lb 6.4 oz (61 kg)   SpO2 100%   BMI 22.37 kg/m    Physical Exam Vitals reviewed.  Constitutional:      Appearance: She is not ill-appearing.  HENT:     Head: Normocephalic.  Eyes:     Conjunctiva/sclera: Conjunctivae normal.  Cardiovascular:     Rate and Rhythm: Normal rate.  Pulmonary:     Effort: Pulmonary effort is normal. No respiratory distress.  Neurological:     General: No focal deficit present.     Mental Status: She is alert and oriented to person, place, and time.  Psychiatric:        Mood and Affect: Mood normal.        Behavior: Behavior normal.      No results found for any visits on 12/04/21.  Assessment & Plan     Problem List Items Addressed This Visit       Endocrine   DM (diabetes mellitus), type 1 (HCC) (Chronic)    Now controlled and follows with endocrinology  Mutual decision for 5 days a month of intermittent leave-- if needed for DM Stacy George / panic attacks (see anxiety note)        Other   GAD (generalized anxiety disorder) - Primary    Taking prozac 20 mg, feels her anxiety improved but now since starting back work is worsened Taking xanax 0.25 mg prn panic attacks Continue with treatment plan Again stressed therapy and gave virtual resources Discussed  intermittent leave-- 5 days a month if needed more for her DM Stacy George but in case of panic attack as well.       Relevant Medications   FLUoxetine (PROZAC) 20 MG capsule   ALPRAZolam (XANAX) 0.25 MG tablet   Other Visit Diagnoses     Need for immunization against influenza       Relevant Orders   Flu Vaccine QUAD 6+ mos PF IM (Fluarix Quad PF)       Return in about 6 months (around 06/04/2022) for CPE.      Stacy George, 08/04/2022, PA-C have  reviewed all documentation for this visit. The documentation on  12/04/2021  for the exam, diagnosis, procedures, and orders are all accurate and complete.Stacy Ferguson, PA-C [facility] [address] [place], [place], [address] Office: [phone] Fax: [phone]   [facility]

## 2021-12-04 NOTE — Assessment & Plan Note (Signed)
Now controlled and follows with endocrinology  Mutual decision for 5 days a month of intermittent leave-- if needed for DM I / panic attacks (see anxiety note)

## 2021-12-04 NOTE — Assessment & Plan Note (Addendum)
Taking prozac 20 mg, feels her anxiety improved but now since starting back work is worsened Taking xanax 0.25 mg prn panic attacks Continue with treatment plan Again stressed therapy and gave virtual resources Discussed intermittent leave-- 5 days a month if needed more for her DM I but in case of panic attack as well.

## 2022-02-27 ENCOUNTER — Ambulatory Visit (INDEPENDENT_AMBULATORY_CARE_PROVIDER_SITE_OTHER): Payer: BC Managed Care – PPO | Admitting: Physician Assistant

## 2022-02-27 ENCOUNTER — Encounter: Payer: Self-pay | Admitting: Physician Assistant

## 2022-02-27 VITALS — BP 90/71 | HR 76 | Temp 98.6°F | Wt 131.8 lb

## 2022-02-27 DIAGNOSIS — L209 Atopic dermatitis, unspecified: Secondary | ICD-10-CM | POA: Diagnosis not present

## 2022-02-27 NOTE — Patient Instructions (Addendum)
[email or web address]  [email or web address]

## 2022-02-27 NOTE — Progress Notes (Signed)
Established patient visit   Patient: Stacy George   DOB: 06-28-1995   26 y.o. Female  MRN: [id number] Visit Date: 02/27/2022  Today's healthcare provider: [name], PA-C     Subjective    Pt reports using her dexcom as usual on the left side of her abdomen -- removed after 10 days, and then yesterday she felt itching and noticed a bumpy rash on the skin where the dexcom was. She went to urgent care and they recommended hydrocortisone and benadryl. Reports symptoms are better. Denies having issue w/ device adhesive before. Medications: Outpatient Medications Prior to Visit  Medication Sig   albuterol (VENTOLIN HFA) 108 (90 Base) MCG/ACT inhaler Inhale 2 puffs into the lungs every 6 (six) hours as needed for wheezing or shortness of breath.   ALPRAZolam (XANAX) 0.25 MG tablet Take 1-2 tablets (0.25-0.5 mg total) by mouth 2 (two) times daily as needed for anxiety.   Continuous Blood Gluc Sensor (FREESTYLE LIBRE 2 SENSOR) MISC Apply device according to manufacturers instructions for continuous blood glucose monitoring.   FLUoxetine (PROZAC) 20 MG capsule Take 1 capsule (20 mg total) by mouth daily.   fluticasone (FLONASE) 50 MCG/ACT nasal spray Place 2 sprays into both nostrils daily.   insulin detemir (LEVEMIR) 100 UNIT/ML FlexPen Inject 10 Units into the skin at bedtime.   insulin lispro (HUMALOG) 200 UNIT/ML KwikPen Before meals: 121-150: 1 unit, 151-200: 2 units, 201-250: 3 units, 251-300: 5 units, 301-350: 7 units, 351-400: 9 units      Bedtime: 121-200: 0 units, 201-250: 2 units, 251-300: 3 units, 301-350: 4 units, 351-400: 5 units   Insulin Pen Needle (PEN NEEDLES) 31G X 5 MM MISC Check blood sugar 4 times daily, before meals, fasting, and before bed   benzonatate (TESSALON) 100 MG capsule Take 1 capsule (100 mg total) by mouth 3 (three) times daily as needed. (Patient not taking: Reported on 02/27/2022)   dicyclomine (BENTYL) 10 MG capsule Take 1 capsule (10 mg total) by  mouth 4 (four) times daily -  before meals and at bedtime for 5 days.   SODIUM FLUORIDE 5000 PPM 1.1 % PSTE SMARTSIG:sparingly By Mouth Every Night (Patient not taking: Reported on 02/27/2022)   No facility-administered medications prior to visit.    Review of Systems  Constitutional:  Negative for fatigue and fever.  Respiratory:  Negative for cough and shortness of breath.   Cardiovascular:  Negative for chest pain and leg swelling.  Gastrointestinal:  Negative for abdominal pain.  Skin:  Positive for rash.  Neurological:  Negative for dizziness and headaches.       Objective    Blood pressure 90/71, pulse 76, temperature 98.6 F (37 C), temperature source Oral, weight 131 lb 12.8 oz (59.8 kg), SpO2 100 %.   Physical Exam Vitals reviewed.  Constitutional:      Appearance: She is not ill-appearing.  HENT:     Head: Normocephalic.  Eyes:     Conjunctiva/sclera: Conjunctivae normal.  Cardiovascular:     Rate and Rhythm: Normal rate.  Pulmonary:     Effort: Pulmonary effort is normal. No respiratory distress.  Skin:    Comments: No erythema put a papular rash L lower abdomen.   Neurological:     General: No focal deficit present.     Mental Status: She is alert and oriented to person, place, and time.  Psychiatric:        Mood and Affect: Mood normal.  Behavior: Behavior normal.     No results found for any visits on 02/27/22.  Assessment & Plan     Atopic derm Odd timing of reaction given device was off. Advised w/ caution to use again and monitor for reaction -- if occurs, hydrocortisone topically, benadryl.   Return if symptoms worsen or fail to improve.      I, [name], PA-C have reviewed all documentation for this visit. The documentation on  02/27/2022  for the exam, diagnosis, procedures, and orders are all accurate and complete.  [name], PA-C [facility] [address] [place], [place], [address] Office:  [phone] Fax: [phone]   [facility]

## 2022-03-11 ENCOUNTER — Telehealth: Payer: BC Managed Care – PPO | Admitting: Family Medicine

## 2022-03-11 ENCOUNTER — Encounter: Payer: BC Managed Care – PPO | Admitting: Nurse Practitioner

## 2022-03-11 DIAGNOSIS — K2970 Gastritis, unspecified, without bleeding: Secondary | ICD-10-CM

## 2022-03-11 MED ORDER — ONDANSETRON HCL 4 MG PO TABS: 4.0000 mg | ORAL_TABLET | Freq: Three times a day (TID) | ORAL | 0 refills | Status: AC | PRN

## 2022-03-11 NOTE — Patient Instructions (Addendum)
Stacy George, thank you for joining [name], NP for today's virtual visit.  While this provider is not your primary care provider (PCP), if your PCP is located in our provider database this encounter information will be shared with them immediately following your visit.   A [facility] MyChart account gives you access to today's visit and all your visits, tests, and labs performed at [facility] " click here if you don't have a [facility] MyChart account or go to [email or web address]  Consent: (Patient) Stacy George provided verbal consent for this virtual visit at the beginning of the encounter.  Current Medications:  Current Outpatient Medications:    ondansetron (ZOFRAN) 4 MG tablet, Take 1 tablet (4 mg total) by mouth every 8 (eight) hours as needed for nausea or vomiting., Disp: 15 tablet, Rfl: 0   albuterol (VENTOLIN HFA) 108 (90 Base) MCG/ACT inhaler, Inhale 2 puffs into the lungs every 6 (six) hours as needed for wheezing or shortness of breath., Disp: 8 g, Rfl: 0   ALPRAZolam (XANAX) 0.25 MG tablet, Take 1-2 tablets (0.25-0.5 mg total) by mouth 2 (two) times daily as needed for anxiety., Disp: 30 tablet, Rfl: 0   benzonatate (TESSALON) 100 MG capsule, Take 1 capsule (100 mg total) by mouth 3 (three) times daily as needed. (Patient not taking: Reported on 02/27/2022), Disp: 30 capsule, Rfl: 0   Continuous Blood Gluc Sensor (FREESTYLE LIBRE 2 SENSOR) MISC, Apply device according to manufacturers instructions for continuous blood glucose monitoring., Disp: 2 each, Rfl: 5   dicyclomine (BENTYL) 10 MG capsule, Take 1 capsule (10 mg total) by mouth 4 (four) times daily -  before meals and at bedtime for 5 days., Disp: 20 capsule, Rfl: 0   FLUoxetine (PROZAC) 20 MG capsule, Take 1 capsule (20 mg total) by mouth daily., Disp: 90 capsule, Rfl: 3   fluticasone (FLONASE) 50 MCG/ACT nasal spray, Place 2 sprays into both nostrils daily., Disp: 16 g, Rfl: 6   insulin  detemir (LEVEMIR) 100 UNIT/ML FlexPen, Inject 10 Units into the skin at bedtime., Disp: 15 mL, Rfl: 2   insulin lispro (HUMALOG) 200 UNIT/ML KwikPen, Before meals: 121-150: 1 unit, 151-200: 2 units, 201-250: 3 units, 251-300: 5 units, 301-350: 7 units, 351-400: 9 units      Bedtime: 121-200: 0 units, 201-250: 2 units, 251-300: 3 units, 301-350: 4 units, 351-400: 5 units, Disp: 3 mL, Rfl: 11   Insulin Pen Needle (PEN NEEDLES) 31G X 5 MM MISC, Check blood sugar 4 times daily, before meals, fasting, and before bed, Disp: 100 each, Rfl: 3   SODIUM FLUORIDE 5000 PPM 1.1 % PSTE, SMARTSIG:sparingly By Mouth Every Night (Patient not taking: Reported on 02/27/2022), Disp: , Rfl:    Medications ordered in this encounter:  Meds ordered this encounter  Medications   ondansetron (ZOFRAN) 4 MG tablet    Sig: Take 1 tablet (4 mg total) by mouth every 8 (eight) hours as needed for nausea or vomiting.    Dispense:  15 tablet    Refill:  0    Order Specific Question:   Supervising Provider    Answer:   Merrilee Jansky [id number]     *If you need refills on other medications prior to your next appointment, please contact your pharmacy*  Follow-Up: Call back or seek an in-person evaluation if the symptoms worsen or if the condition fails to improve as anticipated.  [facility] [phone]  Other Instructions  Gastritis, Adult Gastritis is irritation  and swelling (inflammation) of the stomach. There are two kinds of gastritis: Acute gastritis. This kind develops quickly. Chronic gastritis. This kind is much more common. It develops slowly and lasts for a long time. It is important to get help for this condition. If you do not get help, your stomach can bleed, and you can get sores (ulcers) in your stomach. What are the causes? This condition may be caused by: Germs that get to your stomach and cause an infection. Drinking too much alcohol. Medicines you are taking. Having too much acid  in the stomach. Having a disease of the stomach. Other causes may include: An allergic reaction. Some cancer treatments (radiation). Smoking cigarettes or using products that contain nicotine or tobacco. In some cases, the cause of this condition is not known. What increases the risk? Having a disease of the intestines. Having Crohn's disease. Using aspirin or ibuprofen and other NSAIDs to treat other conditions. Stress. What are the signs or symptoms? Pain in your stomach. A burning feeling in your stomach. Feeling like you may vomit (nauseous). Vomiting or vomiting blood. Feeling too full after you eat. Weight loss. Bad breath. Blood in your poop (stool). In some cases, there are no symptoms. How is this treated? This condition is treated with medicines. The medicines that are used depend on what caused the condition. You may be given: Antibiotic medicine, if your condition was caused by an infection from germs. H2 blockers and similar medicines, if your condition was caused by too much acid in the stomach. Treatment may also include stopping the use of certain medicines, such as aspirin or ibuprofen. Follow these instructions at home: Medicines Take over-the-counter and prescription medicines only as told by your doctor. If you were prescribed an antibiotic medicine, take it as told by your doctor. Do not stop taking it even if you start to feel better. Alcohol use Do not drink alcohol if: Your doctor tells you not to drink. You are pregnant, may be pregnant, or are planning to become pregnant. If you drink alcohol: Limit your use to: 0-1 drink a day for women. 0-2 drinks a day for men. Know how much alcohol is in your drink. In the [place] one drink equals one 12 oz bottle of beer (355 mL), one 5 oz glass of wine (148 mL), or one 1 oz glass of hard liquor (44 mL). General instructions  Eat small meals often, instead of large meals. Avoid foods and drinks that make you  feel worse. Drink enough fluid to keep your pee (urine) pale yellow. Talk with your doctor about ways to manage stress. You can exercise or do deep breathing, meditation, or yoga. Do not smoke or use any products that contain nicotine or tobacco. If you need help quitting, ask your doctor. Keep all follow-up visits. Contact a doctor if: Your symptoms get worse. Your stomach pain gets worse. Your symptoms go away and then come back. You have a fever. Get help right away if: You vomit blood or something that looks like coffee grounds. You have black or dark red poop. You throw up any time you try to drink fluids. These symptoms may be an emergency. Get help right away. Call your local emergency services (911 in the [place] Do not wait to see if the symptoms will go away. Do not drive yourself to the hospital. Summary Gastritis is irritation and swelling (inflammation) of the stomach. You must get help for this condition. If you do not get help, your  stomach can bleed, and you can get sores (ulcers) in your stomach. You can be treated with medicines for germs or medicines to block too much acid in your stomach. This information is not intended to replace advice given to you by your health care provider. Make sure you discuss any questions you have with your health care provider. Document Revised: 07/21/2020 Document Reviewed: 07/21/2020 Elsevier Patient Education  2023 Elsevier Inc.    If you have been instructed to have an in-person evaluation today at a local Urgent Care facility, please use the link below. It will take you to a list of all of our available [facility], including address, phone number and hours of operation. Please do not delay care.  [facility]  If you or a family member do not have a primary care provider, use the link below to schedule a visit and establish care. When you choose a [facility] primary care physician or advanced practice  provider, you gain a long-term partner in health. Find a Primary Care Provider  Learn more about [facility]'s in-office and virtual care options:  - Get Care Now

## 2022-03-11 NOTE — Progress Notes (Signed)
Virtual Visit Consent   Stacy George, you are scheduled for a virtual visit with a [facility] provider today. Just as with appointments in the office, your consent must be obtained to participate. Your consent will be active for this visit and any virtual visit you may have with one of our providers in the next 365 days. If you have a MyChart account, a copy of this consent can be sent to you electronically.  As this is a virtual visit, video technology does not allow for your provider to perform a traditional examination. This may limit your provider's ability to fully assess your condition. If your provider identifies any concerns that need to be evaluated in person or the need to arrange testing (such as labs, EKG, etc.), we will make arrangements to do so. Although advances in technology are sophisticated, we cannot ensure that it will always work on either your end or our end. If the connection with a video visit is poor, the visit may have to be switched to a telephone visit. With either a video or telephone visit, we are not always able to ensure that we have a secure connection.  By engaging in this virtual visit, you consent to the provision of healthcare and authorize for your insurance to be billed (if applicable) for the services provided during this visit. Depending on your insurance coverage, you may receive a charge related to this service.  I need to obtain your verbal consent now. Are you willing to proceed with your visit today? Stacy George has provided verbal consent on 03/11/2022 for a virtual visit (video or telephone). Stacy Finner, NP  Date: 03/11/2022 11:32 AM  Virtual Visit via Video Note   I, Stacy George, connected with  Stacy George  ([id number], 1995/11/01) on 03/11/22 at 11:30 AM EST by a video-enabled telemedicine application and verified that I am speaking with the correct person using two identifiers.  Location: Patient: Virtual Visit Location Patient:  Home Provider: Virtual Visit Location Provider: Home Office   I discussed the limitations of evaluation and management by telemedicine and the availability of in person appointments. The patient expressed understanding and agreed to proceed.    History of Present Illness: Stacy George is a 26 y.o. who identifies as a female who was assigned female at birth, and is being seen today for stomach virus- nausea, vomiting and diarrhea. Not related to a meal. Exposed to others that have same symptoms. Last episode was an hour ago. Unable to tolerate fluids well. Feels chills. Is type 1 DM CBG was 230- does report some dizziness. Denies fevers, chest pain, shortness of breath.   Problems:  Patient Active Problem List   Diagnosis Date Noted   Right foot pain 09/10/2021   Genetic testing 08/06/2021   GAD (generalized anxiety disorder) 08/02/2021   Urinary frequency 06/25/2021   Family history of pancreatic cancer 06/25/2021   DKA (diabetic ketoacidosis) (HCC) 06/07/2021   DM (diabetes mellitus), type 1 (HCC) 06/06/2021   Metabolic acidosis 06/06/2021   Migraines 03/27/2021    Allergies:  Allergies  Allergen Reactions   Other     Environmental allergies   Medications:  Current Outpatient Medications:    albuterol (VENTOLIN HFA) 108 (90 Base) MCG/ACT inhaler, Inhale 2 puffs into the lungs every 6 (six) hours as needed for wheezing or shortness of breath., Disp: 8 g, Rfl: 0   ALPRAZolam (XANAX) 0.25 MG tablet, Take 1-2 tablets (0.25-0.5 mg total) by mouth 2 (two) times daily as  needed for anxiety., Disp: 30 tablet, Rfl: 0   benzonatate (TESSALON) 100 MG capsule, Take 1 capsule (100 mg total) by mouth 3 (three) times daily as needed. (Patient not taking: Reported on 02/27/2022), Disp: 30 capsule, Rfl: 0   Continuous Blood Gluc Sensor (FREESTYLE LIBRE 2 SENSOR) MISC, Apply device according to manufacturers instructions for continuous blood glucose monitoring., Disp: 2 each, Rfl: 5   dicyclomine  (BENTYL) 10 MG capsule, Take 1 capsule (10 mg total) by mouth 4 (four) times daily -  before meals and at bedtime for 5 days., Disp: 20 capsule, Rfl: 0   FLUoxetine (PROZAC) 20 MG capsule, Take 1 capsule (20 mg total) by mouth daily., Disp: 90 capsule, Rfl: 3   fluticasone (FLONASE) 50 MCG/ACT nasal spray, Place 2 sprays into both nostrils daily., Disp: 16 g, Rfl: 6   insulin detemir (LEVEMIR) 100 UNIT/ML FlexPen, Inject 10 Units into the skin at bedtime., Disp: 15 mL, Rfl: 2   insulin lispro (HUMALOG) 200 UNIT/ML KwikPen, Before meals: 121-150: 1 unit, 151-200: 2 units, 201-250: 3 units, 251-300: 5 units, 301-350: 7 units, 351-400: 9 units      Bedtime: 121-200: 0 units, 201-250: 2 units, 251-300: 3 units, 301-350: 4 units, 351-400: 5 units, Disp: 3 mL, Rfl: 11   Insulin Pen Needle (PEN NEEDLES) 31G X 5 MM MISC, Check blood sugar 4 times daily, before meals, fasting, and before bed, Disp: 100 each, Rfl: 3   SODIUM FLUORIDE 5000 PPM 1.1 % PSTE, SMARTSIG:sparingly By Mouth Every Night (Patient not taking: Reported on 02/27/2022), Disp: , Rfl:   Observations/Objective: Patient is well-developed, well-nourished in no acute distress.  Resting comfortably  at home.  Head is normocephalic, atraumatic.  No labored breathing.  Speech is clear and coherent with logical content.  Patient is alert and oriented at baseline.    Assessment and Plan:  1. Viral gastritis  - ondansetron (ZOFRAN) 4 MG tablet; Take 1 tablet (4 mg total) by mouth every 8 (eight) hours as needed for nausea or vomiting.  Dispense: 15 tablet; Refill: 0  -rest -bland diet, hydrate -info on AVS for OTC symptom management   Reviewed side effects, risks and benefits of medication.     Patient acknowledged agreement and understanding of the plan.   Past Medical, Surgical, Social History, Allergies, and Medications have been Reviewed.     Follow Up Instructions: I discussed the assessment and treatment plan with the  patient. The patient was provided an opportunity to ask questions and all were answered. The patient agreed with the plan and demonstrated an understanding of the instructions.  A copy of instructions were sent to the patient via MyChart unless otherwise noted below.     The patient was advised to call back or seek an in-person evaluation if the symptoms worsen or if the condition fails to improve as anticipated.  Time:  I spent 10 minutes with the patient via telehealth technology discussing the above problems/concerns.    Stacy Finner, NP

## 2022-03-11 NOTE — Progress Notes (Signed)
Because of the color of your bowel movement and the length of time you have been ill, I feel your condition warrants further evaluation and I recommend that you be seen for a face to face visit.  Please contact your primary care physician practice to be seen. Many offices offer virtual options to be seen via video if you are not comfortable going in person to a medical facility at this time.  If you cannot be seen at your primary care office in the next 24 hours I would advise being seen at a local urgent care.   NOTE: You will NOT be charged for this eVisit.  If you do not have a PCP, [facility] offers a free physician referral service available at [phone]. Our trained staff has the experience, knowledge and resources to put you in touch with a physician who is right for you.    If you are having a true medical emergency please call 911.   Your e-visit answers were reviewed by a board certified advanced clinical practitioner to complete your personal care plan.  Thank you for using e-Visits.

## 2022-03-26 ENCOUNTER — Ambulatory Visit: Payer: Self-pay

## 2022-03-26 NOTE — Telephone Encounter (Signed)
     Chief Complaint: Since yesterday, BS elevated. Fasting 345, today 220. No availability with PCP. Appointment made for Friday. Asking to be worked in with PCP Symptoms: Headache, Frequency: 2 days Pertinent Negatives: Patient denies  Disposition: [] ED /[] Urgent Care (no appt availability in office) / [] Appointment(In office/virtual)/ []  [facility]/ [] Home Care/ [] Refused Recommended Disposition /[]  Mobile Bus/ [x]  Follow-up with PCP Additional Notes: Please advise pt.  Reason for Disposition  [1] Blood glucose > 300 mg/dL ( mmol/L) AND uses insulin (e.g., insulin-dependent, all people with type 1 diabetes)  Answer Assessment - Initial Assessment Questions 1. BLOOD GLUCOSE: "What is your blood glucose level?"      Yesterday - fasting 345 2. ONSET: "When did you check the blood glucose?"     This morning 220 3. USUAL RANGE: "What is your glucose level usually?" (e.g., usual fasting morning value, usual evening value)     80-140 4. KETONES: "Do you check for ketones (urine or blood test strips)?" If Yes, ask: "What does the test show now?"      No 5. TYPE 1 or 2:  "Do you know what type of diabetes you have?"  (e.g., Type 1, Type 2, Gestational; doesn't know)      Type 1 6. INSULIN: "Do you take insulin?" "What type of insulin(s) do you use? What is the mode of delivery? (syringe, pen; injection or pump)?"      Yes 7. DIABETES PILLS: "Do you take any pills for your diabetes?" If Yes, ask: "Have you missed taking any pills recently?"     No 8. OTHER SYMPTOMS: "Do you have any symptoms?" (e.g., fever, frequent urination, difficulty breathing, dizziness, weakness, vomiting)     Headache, dizziness 9. PREGNANCY: "Is there any chance you are pregnant?" "When was your last menstrual period?"     No  Protocols used: Diabetes - High Blood Sugar-A-AH

## 2022-03-26 NOTE — Telephone Encounter (Signed)
Patient requested to be seen with Stacy George. Advised patient if symptoms get worse to go to ED or UC.

## 2022-03-26 NOTE — Telephone Encounter (Signed)
Patient was advised.  

## 2022-03-28 ENCOUNTER — Encounter: Payer: Self-pay | Admitting: Obstetrics

## 2022-03-28 ENCOUNTER — Ambulatory Visit: Payer: BC Managed Care – PPO | Admitting: Family Medicine

## 2022-04-01 ENCOUNTER — Ambulatory Visit: Payer: BC Managed Care – PPO | Admitting: Physician Assistant

## 2022-04-02 ENCOUNTER — Ambulatory Visit: Payer: BC Managed Care – PPO | Admitting: Obstetrics

## 2022-04-10 ENCOUNTER — Other Ambulatory Visit (HOSPITAL_COMMUNITY)
Admission: RE | Admit: 2022-04-10 | Discharge: 2022-04-10 | Disposition: A | Discharge status: Home / Self Care | Payer: 59 | Source: Ambulatory Visit | Attending: Obstetrics | Admitting: Obstetrics

## 2022-04-10 ENCOUNTER — Ambulatory Visit: Payer: 59 | Admitting: Obstetrics

## 2022-04-10 ENCOUNTER — Encounter: Payer: Self-pay | Admitting: Obstetrics

## 2022-04-10 VITALS — BP 133/82 | HR 81 | Ht 66.0 in | Wt 135.0 lb

## 2022-04-10 DIAGNOSIS — F419 Anxiety disorder, unspecified: Secondary | ICD-10-CM | POA: Diagnosis not present

## 2022-04-10 DIAGNOSIS — Z01419 Encounter for gynecological examination (general) (routine) without abnormal findings: Secondary | ICD-10-CM

## 2022-04-10 DIAGNOSIS — N898 Other specified noninflammatory disorders of vagina: Secondary | ICD-10-CM

## 2022-04-10 DIAGNOSIS — N939 Abnormal uterine and vaginal bleeding, unspecified: Secondary | ICD-10-CM

## 2022-04-10 DIAGNOSIS — R519 Headache, unspecified: Secondary | ICD-10-CM

## 2022-04-10 MED ORDER — FLUOXETINE HCL 20 MG PO CAPS: 40.0000 mg | ORAL_CAPSULE | Freq: Every day | ORAL | 3 refills | Status: DC

## 2022-04-10 NOTE — Progress Notes (Signed)
SUBJECTIVE  HPI  Stacy George is a 27 y.o.-year-old G0P0000 who presents for an annual gynecological exam today. Her health concerns include frequent headaches, worsening anxiety, and heavy periods with debilitating cramps and nausea/vomiting. She is nauseated daily now; she takes Zofran and carries emesis bags with her due the frequency of vomiting. She has had a series of high-stress life events over the past year. She reports that she often can't stop her mind from racing and has had panic attacks. Her blood sugars have been well-controlled with an insulin pump. She notes that her vision has worsened despite corrective lenses.   Medical/Surgical History Past Medical History:  Diagnosis Date   Anxiety    Diabetes mellitus without complication (HCC)    Migraines    Periodontal disease    Seasonal allergies    Past Surgical History:  Procedure Laterality Date   KNEE SURGERY     cyst and fluid removed from behind knee cap   NASAL POLYP EXCISION     periodontal surgery     WISDOM TOOTH EXTRACTION      Social History Lives with husband Work: Corporate treasurer at [employer] Exercise: regular Substances: denies  Obstetric History OB History     Gravida  0   Para  0   Term  0   Preterm  0   AB  0   Living  0      SAB  0   IAB  0   Ectopic  0   Multiple  0   Live Births  0            GYN/Menstrual History Patient's last menstrual period was 03/25/2022 (exact date). regular periods every month Last Pap: 03/28/21. NILM. Contraception: none  Prevention Endorses regular dental and eye exams Mammogram: at 42 Colonoscopy: at 42  Current Medications Outpatient Medications Prior to Visit  Medication Sig   albuterol (VENTOLIN HFA) 108 (90 Base) MCG/ACT inhaler Inhale 2 puffs into the lungs every 6 (six) hours as needed for wheezing or shortness of breath.   ALPRAZolam (XANAX) 0.25 MG tablet Take 1-2 tablets (0.25-0.5 mg total) by mouth 2 (two) times daily as  needed for anxiety.   fluticasone (FLONASE) 50 MCG/ACT nasal spray Place 2 sprays into both nostrils daily.   insulin detemir (LEVEMIR) 100 UNIT/ML FlexPen Inject 10 Units into the skin at bedtime.   insulin lispro (HUMALOG) 200 UNIT/ML KwikPen Before meals: 121-150: 1 unit, 151-200: 2 units, 201-250: 3 units, 251-300: 5 units, 301-350: 7 units, 351-400: 9 units      Bedtime: 121-200: 0 units, 201-250: 2 units, 251-300: 3 units, 301-350: 4 units, 351-400: 5 units   Insulin Pen Needle (PEN NEEDLES) 31G X 5 MM MISC Check blood sugar 4 times daily, before meals, fasting, and before bed   ondansetron (ZOFRAN) 4 MG tablet Take 1 tablet (4 mg total) by mouth every 8 (eight) hours as needed for nausea or vomiting.   [DISCONTINUED] FLUoxetine (PROZAC) 20 MG capsule Take 1 capsule (20 mg total) by mouth daily.   Continuous Blood Gluc Sensor (FREESTYLE LIBRE 2 SENSOR) MISC Apply device according to manufacturers instructions for continuous blood glucose monitoring.   dicyclomine (BENTYL) 10 MG capsule Take 1 capsule (10 mg total) by mouth 4 (four) times daily -  before meals and at bedtime for 5 days.   [DISCONTINUED] benzonatate (TESSALON) 100 MG capsule Take 1 capsule (100 mg total) by mouth 3 (three) times daily as needed. (Patient not taking: Reported on 02/27/2022)   [  DISCONTINUED] SODIUM FLUORIDE 5000 PPM 1.1 % PSTE SMARTSIG:sparingly By Mouth Every Night (Patient not taking: Reported on 02/27/2022)   No facility-administered medications prior to visit.      Upstream - 04/10/22 1331       Pregnancy Intention Screening   Does the patient want to become pregnant in the next year? Ok Either Way    Does the patient's partner want to become pregnant in the next year? Ok Either Way    Would the patient like to discuss contraceptive options today? No      Contraception Wrap Up   Current Method No Contraceptive Precautions    Contraception Counseling Provided No    How was the end contraceptive method  provided? N/A            The pregnancy intention screening data noted above was reviewed. Potential methods of contraception were discussed. The patient elected to proceed with No data recorded.   ROS History obtained from the patient General ROS: negative for - chills, fever, hot flashes, or night sweats Psychological ROS: positive for - anxiety and impulsive thoughts Ophthalmic ROS: positive for - decreased vision ENT ROS: positive for - headaches negative for - hearing change or sore throat Hematological and Lymphatic ROS: negative for - bleeding problems, bruising, or swollen lymph nodes Endocrine ROS: positive for - polydipsia/polyuria negative for - breast changes, palpitations, temperature intolerance, or unexpected weight changes Breast ROS: negative for breast lumps Respiratory ROS: no cough, shortness of breath, or wheezing Cardiovascular ROS: no chest pain or dyspnea on exertion Gastrointestinal ROS: no abdominal pain, change in bowel habits, or black or bloody stools positive for - nausea/vomiting Genito-Urinary ROS: no dysuria, trouble voiding, or hematuria positive for - change in menstrual cycle and vulvar/vaginal symptoms Musculoskeletal ROS: negative Dermatological ROS: negative     02/27/2022    3:24 PM 12/04/2021    3:04 PM 08/02/2021    8:18 AM 06/21/2021    1:59 PM 06/20/2021    8:58 AM  Depression screen PHQ 2/9  Decreased Interest 2 3 2 2  0  Down, Depressed, Hopeless 1 2  1  0  PHQ - 2 Score 3 5 2 3  0  Altered sleeping 2 3 3 1    Tired, decreased energy 2 3 3 2    Change in appetite 2 2 1 2    Feeling bad or failure about yourself  1 2 1  0   Trouble concentrating 3 3 3 3    Moving slowly or fidgety/restless 2 2 2  0   Suicidal thoughts 0 0 0 0   PHQ-9 Score 15 20 15 11    Difficult doing work/chores Very difficult Extremely dIfficult Extremely dIfficult Somewhat difficult      OBJECTIVE  Last Weight  Most recent update: 04/10/2022  1:33 PM    Weight   61.2 kg (135 lb)             Body mass index is 21.79 kg/m.    BP 133/82   Pulse 81   Ht 5\' 6"  (1.676 m)   Wt 135 lb (61.2 kg)   LMP 03/25/2022 (Exact Date)   BMI 21.79 kg/m  General appearance: alert, cooperative, and appears stated age Head: Normocephalic, without obvious abnormality, atraumatic Eyes: negative findings: lids and lashes normal and conjunctivae and sclerae normal Neck: no adenopathy, supple, symmetrical, trachea midline, and thyroid not enlarged, symmetric, no tenderness/mass/nodules Lungs: clear to auscultation bilaterally Breasts: normal appearance, no masses or tenderness, Inspection negative, No nipple retraction or dimpling, No  nipple discharge or bleeding, No axillary or supraclavicular adenopathy, Normal to palpation without dominant masses Heart: regular rate and rhythm, S1, S2 normal, no murmur, click, rub or gallop Abdomen: soft, non-tender; bowel sounds normal; no masses,  no organomegaly Pelvic: cervix normal in appearance, external genitalia normal, no adnexal masses or tenderness, no cervical motion tenderness, rectovaginal septum normal, vagina normal without discharge, and uterus mildly enlarged; scant amount of white discharge on external genitalia. Extremities: extremities normal, atraumatic, no cyanosis or edema Pulses: 2+ and symmetric Skin: Skin color, texture, turgor normal. No rashes or lesions Lymph nodes: Cervical, supraclavicular, and axillary nodes normal.  ASSESSMENT  1) Annual exam 2) Abnormal uterine bleeding, possible endometriosis 3) Severe anxiety 4) Chronic headaches  PLAN 1) Physical exam as noted. Pap due 02/2024. Declines STI testing. Labs: A1C, TSH, Vit D, CMP, CBC, lipid panel 2) Reviewed various hormonal contraceptive options to control bleeding. Handouts given and Chaunta will consider them. Discussed possible endometriosis and that contraception is typically first line treatment and laparoscopy may be an option for  diagnosis. Pelvic US ordered for possible enlarged uterus. 3) Increased dose of Lexapro. Will start at 30 and increase to 40 after 1-2 weeks if desired. Referral to psychiatry for medication management. List of therapists sent. Discussed meditation and exercise. 4) Referral to neurology  Return for contraception PRN and annual visit in one year.  Lloyd Huger, CNM

## 2022-04-11 ENCOUNTER — Encounter: Payer: Self-pay | Admitting: Obstetrics

## 2022-04-11 LAB — COMPREHENSIVE METABOLIC PANEL
ALT: 9 IU/L (ref 0–32)
AST: 12 IU/L (ref 0–40)
Albumin/Globulin Ratio: 1.8 (ref 1.2–2.2)
Albumin: 4.6 g/dL (ref 4.0–5.0)
Alkaline Phosphatase: 65 IU/L (ref 44–121)
BUN/Creatinine Ratio: 12 (ref 9–23)
BUN: 6 mg/dL (ref 6–20)
Bilirubin Total: 0.3 mg/dL (ref 0.0–1.2)
CO2: 20 mmol/L (ref 20–29)
Calcium: 9.6 mg/dL (ref 8.7–10.2)
Chloride: 103 mmol/L (ref 96–106)
Creatinine, Ser: 0.52 mg/dL — ABNORMAL LOW (ref 0.57–1.00)
Globulin, Total: 2.6 g/dL (ref 1.5–4.5)
Glucose: 101 mg/dL — ABNORMAL HIGH (ref 70–99)
Potassium: 3.9 mmol/L (ref 3.5–5.2)
Sodium: 138 mmol/L (ref 134–144)
Total Protein: 7.2 g/dL (ref 6.0–8.5)
eGFR: 133 mL/min/{1.73_m2} (ref >59)

## 2022-04-11 LAB — VITAMIN D 25 HYDROXY (VIT D DEFICIENCY, FRACTURES): Vit D, 25-Hydroxy: 8.8 ng/mL — ABNORMAL LOW (ref 30.0–100.0)

## 2022-04-11 LAB — LIPID PANEL
Chol/HDL Ratio: 2.4 ratio (ref 0.0–4.4)
Cholesterol, Total: 169 mg/dL (ref 100–199)
HDL: 69 mg/dL (ref >39)
LDL Chol Calc (NIH): 88 mg/dL (ref 0–99)
Triglycerides: 63 mg/dL (ref 0–149)
VLDL Cholesterol Cal: 12 mg/dL (ref 5–40)

## 2022-04-11 LAB — CBC
Hematocrit: 37.2 % (ref 34.0–46.6)
Hemoglobin: 12.3 g/dL (ref 11.1–15.9)
MCH: 29.9 pg (ref 26.6–33.0)
MCHC: 33.1 g/dL (ref 31.5–35.7)
MCV: 90 fL (ref 79–97)
Platelets: 354 10*3/uL (ref 150–450)
RBC: 4.12 x10E6/uL (ref 3.77–5.28)
RDW: 12.1 % (ref 11.7–15.4)
WBC: 13.3 10*3/uL — ABNORMAL HIGH (ref 3.4–10.8)

## 2022-04-11 LAB — HEMOGLOBIN A1C
Est. average glucose Bld gHb Est-mCnc: 128 mg/dL
Hgb A1c MFr Bld: 6.1 % — ABNORMAL HIGH (ref 4.8–5.6)

## 2022-04-11 LAB — TSH: TSH: 0.83 u[IU]/mL (ref 0.450–4.500)

## 2022-04-11 MED ORDER — VITAMIN D (ERGOCALCIFEROL) 1.25 MG (50000 UNIT) PO CAPS: 50000.0000 [IU] | ORAL_CAPSULE | ORAL | 0 refills | Status: DC

## 2022-04-13 LAB — CERVICOVAGINAL ANCILLARY ONLY
Bacterial Vaginitis (gardnerella): NEGATIVE
Candida Glabrata: NEGATIVE
Candida Vaginitis: NEGATIVE
Comment: NEGATIVE
Comment: NEGATIVE
Comment: NEGATIVE

## 2022-04-22 ENCOUNTER — Other Ambulatory Visit: Payer: 59

## 2022-04-22 ENCOUNTER — Encounter: Payer: Self-pay | Admitting: Neurology

## 2022-04-28 ENCOUNTER — Encounter: Payer: Self-pay | Admitting: Obstetrics

## 2022-05-01 ENCOUNTER — Ambulatory Visit: Payer: 59

## 2022-05-01 ENCOUNTER — Other Ambulatory Visit: Payer: 59

## 2022-05-02 ENCOUNTER — Ambulatory Visit
Admission: RE | Admit: 2022-05-02 | Discharge: 2022-05-02 | Disposition: A | Discharge status: Home / Self Care | Payer: 59 | Source: Ambulatory Visit | Attending: Obstetrics | Admitting: Obstetrics

## 2022-05-02 DIAGNOSIS — N939 Abnormal uterine and vaginal bleeding, unspecified: Secondary | ICD-10-CM

## 2022-05-06 ENCOUNTER — Encounter: Payer: Self-pay | Admitting: Obstetrics

## 2022-05-06 ENCOUNTER — Ambulatory Visit: Payer: 59 | Admitting: Obstetrics

## 2022-05-06 ENCOUNTER — Other Ambulatory Visit: Payer: Self-pay | Admitting: Obstetrics

## 2022-05-06 VITALS — BP 115/78 | HR 62 | Ht 65.0 in | Wt 130.0 lb

## 2022-05-06 DIAGNOSIS — Z3009 Encounter for other general counseling and advice on contraception: Secondary | ICD-10-CM

## 2022-05-06 MED ORDER — SLYND 4 MG PO TABS: 1.0000 | ORAL_TABLET | Freq: Every day | ORAL | 3 refills | Status: DC

## 2022-05-06 NOTE — Progress Notes (Signed)
Subjective:    Stacy George is a 27 y.o. female who presents for contraception counseling. She patient is sexually active. She has had increasingly heavy menstrual periods and painful cramping. Pelvic US was normal. Pertinent past medical history: migraines.  Menstrual History: OB History     Gravida  0   Para  0   Term  0   Preterm  0   AB  0   Living  0      SAB  0   IAB  0   Ectopic  0   Multiple  0   Live Births  0           Menarche age:  Patient's last menstrual period was 04/24/2022 (exact date). Period Pattern: Regular Menstrual Flow: Light, Moderate, Heavy Menstrual Control: Tampon Menstrual Control Change Freq (Hours): 2-4 Dysmenorrhea: (!) Severe Dysmenorrhea Symptoms: Cramping, Diarrhea, Headache, Throbbing, Nausea  The following portions of the patient's history were reviewed and updated as appropriate: allergies, current medications, past family history, past medical history, past social history, past surgical history, and problem list.  Review of Systems Pertinent items are noted in HPI.   Objective:    No exam performed today, not indicated for contraceptive counseling.   Assessment:    27 y.o., starting oral progesterone-only contraceptive, no contraindications.   Plan:    Reviewed available contraceptive options. Stacy George would like to start on the pill. With her h/o migraine with aura, recommend progesterone only. Rx for [facility] send. Reviewed proper administration. RTC for annual or PRN.  Lurlean Horns, CNM

## 2022-05-07 ENCOUNTER — Other Ambulatory Visit: Payer: Self-pay

## 2022-05-07 ENCOUNTER — Telehealth: Payer: Self-pay

## 2022-05-07 ENCOUNTER — Ambulatory Visit: Payer: 59 | Admitting: Physician Assistant

## 2022-05-07 ENCOUNTER — Encounter: Payer: Self-pay | Admitting: Physician Assistant

## 2022-05-07 VITALS — BP 121/81 | HR 70 | Wt 134.7 lb

## 2022-05-07 DIAGNOSIS — E1065 Type 1 diabetes mellitus with hyperglycemia: Secondary | ICD-10-CM | POA: Diagnosis not present

## 2022-05-07 DIAGNOSIS — Z30011 Encounter for initial prescription of contraceptive pills: Secondary | ICD-10-CM

## 2022-05-07 DIAGNOSIS — Z3009 Encounter for other general counseling and advice on contraception: Secondary | ICD-10-CM

## 2022-05-07 DIAGNOSIS — M2560 Stiffness of unspecified joint, not elsewhere classified: Secondary | ICD-10-CM

## 2022-05-07 MED ORDER — NORETHINDRONE 0.35 MG PO TABS: 1.0000 | ORAL_TABLET | Freq: Every day | ORAL | 3 refills | Status: DC

## 2022-05-07 MED ORDER — INSULIN LISPRO 200 UNIT/ML ~~LOC~~ SOPN: PEN_INJECTOR | SUBCUTANEOUS | 11 refills | Status: DC

## 2022-05-07 NOTE — Telephone Encounter (Signed)
New RX sent patient confirmed understanding, thank you Missy!

## 2022-05-07 NOTE — Assessment & Plan Note (Signed)
Advised pt to stretch, increase fluid intake, monitor symptoms Advised her to discuss w/ endo at next follow up  If worsening symptoms or no changes would start an autoimmune w/u

## 2022-05-07 NOTE — Assessment & Plan Note (Signed)
Pt was instructed by endo to call dexcom and report Refilled humalog pen, advised to go manual until her dexcom matches her fingersticks-- ultimately likely needs to replace her dexcom early d/t malfunction.  Given a freestyle libre 3 in office to help with frequent BS checks

## 2022-05-07 NOTE — Progress Notes (Addendum)
I,[name],acting as a Education administrator for [employer], PA-C.,have documented all relevant documentation on the behalf of Mikey Kirschner, PA-C,as directed by  Mikey Kirschner, PA-C while in the presence of Mikey Kirschner, PA-C.   Established patient visit   Patient: Stacy George   DOB: 08-Aug-1995   27 y.o. Female  MRN: [id number] Visit Date: 05/07/2022  Today's healthcare provider: Mikey Kirschner, PA-C   Cc. Issues with dexcom and omnipod  Subjective    HPI   Pt reports significant issues with her omnipod and dexcom since Thursday x 7 days ago. Her dexcom is reading low when her blood sugar is high, and her omnipod was giving too much insulin that her dexcom was not picking up on. She changed her omnipod twice with the same issue. She does not have any regular insulin to administer if needed.   Reports her bs was in the 200s this morning and she is fasting because she is scared to eat without insulin.    She also reports shoulder and pelvic/hip stiffness in the morning, progressively worsening for the last few weeks. It sometimes takes time in the mornings before she is able to stand up straight/walk without pain.  Denies new medications aside from a vitamin D supplement.   Medications: Outpatient Medications Prior to Visit  Medication Sig   albuterol (VENTOLIN HFA) 108 (90 Base) MCG/ACT inhaler Inhale 2 puffs into the lungs every 6 (six) hours as needed for wheezing or shortness of breath.   ALPRAZolam (XANAX) 0.25 MG tablet Take 1-2 tablets (0.25-0.5 mg total) by mouth 2 (two) times daily as needed for anxiety.   Continuous Blood Gluc Sensor (FREESTYLE LIBRE 2 SENSOR) MISC Apply device according to manufacturers instructions for continuous blood glucose monitoring.   FLUoxetine (PROZAC) 20 MG capsule Take 2 capsules (40 mg total) by mouth daily. Increase to 1.5 tabs for one week. Then increase to 2 tabs.   fluticasone (FLONASE) 50 MCG/ACT nasal spray Place 2 sprays into both  nostrils daily.   insulin detemir (LEVEMIR) 100 UNIT/ML FlexPen Inject 10 Units into the skin at bedtime.   Insulin Pen Needle (PEN NEEDLES) 31G X 5 MM MISC Check blood sugar 4 times daily, before meals, fasting, and before bed   norethindrone (MICRONOR) 0.35 MG tablet Take 1 tablet (0.35 mg total) by mouth daily.   ondansetron (ZOFRAN) 4 MG tablet Take 1 tablet (4 mg total) by mouth every 8 (eight) hours as needed for nausea or vomiting.   Vitamin D, Ergocalciferol, (DRISDOL) 1.25 MG (50000 UNIT) CAPS capsule Take 1 capsule (50,000 Units total) by mouth every 7 (seven) days.   [DISCONTINUED] insulin lispro (HUMALOG) 200 UNIT/ML KwikPen Before meals: 121-150: 1 unit, 151-200: 2 units, 201-250: 3 units, 251-300: 5 units, 301-350: 7 units, 351-400: 9 units      Bedtime: 121-200: 0 units, 201-250: 2 units, 251-300: 3 units, 301-350: 4 units, 351-400: 5 units   dicyclomine (BENTYL) 10 MG capsule Take 1 capsule (10 mg total) by mouth 4 (four) times daily -  before meals and at bedtime for 5 days.   No facility-administered medications prior to visit.    Review of Systems  Constitutional:  Negative for fatigue and fever.  Respiratory:  Negative for cough and shortness of breath.   Cardiovascular:  Negative for chest pain and leg swelling.  Gastrointestinal:  Negative for abdominal pain.  Neurological:  Negative for dizziness and headaches.      Objective    BP 121/81 (BP Location:  Left Arm, Patient Position: Sitting, Cuff Size: Normal)   Pulse 70   Wt 134 lb 11.2 oz (61.1 kg)   LMP 04/24/2022 (Exact Date)   SpO2 100%   BMI 22.42 kg/m  Blood pressure 121/81, pulse 70, weight 134 lb 11.2 oz (61.1 kg), last menstrual period 04/24/2022, SpO2 100 %.   Physical Exam Vitals reviewed.  Constitutional:      Appearance: She is not ill-appearing.  HENT:     Head: Normocephalic.  Eyes:     Conjunctiva/sclera: Conjunctivae normal.  Cardiovascular:     Rate and Rhythm: Normal rate.  Pulmonary:      Effort: Pulmonary effort is normal. No respiratory distress.  Neurological:     General: No focal deficit present.     Mental Status: She is alert and oriented to person, place, and time.  Psychiatric:        Mood and Affect: Mood normal.        Behavior: Behavior normal.      No results found for any visits on 05/07/22.  Assessment & Plan     Problem List Items Addressed This Visit       Endocrine   DM (diabetes mellitus), type 1 ([place]) - Primary (Chronic)    Pt was instructed by endo to call dexcom and report Refilled humalog pen, advised to go manual until her dexcom matches her fingersticks-- ultimately likely needs to replace her dexcom early d/t malfunction.  Given a freestyle libre 3 in office to help with frequent BS checks       Relevant Medications   insulin lispro (HUMALOG) 200 UNIT/ML KwikPen     Other   Joint stiffness    Advised pt to stretch, increase fluid intake, monitor symptoms Advised her to discuss w/ endo at next follow up  If worsening symptoms or no changes would start an autoimmune w/u        Return if symptoms worsen or fail to improve.      I, Mikey Kirschner, PA-C have reviewed all documentation for this visit. The documentation on 05/07/22  for the exam, diagnosis, procedures, and orders are all accurate and complete. Mikey Kirschner, PA-C [facility] [address] [place], [place], [address] Office: [phone] Fax: [place]

## 2022-05-28 ENCOUNTER — Ambulatory Visit: Payer: Self-pay | Admitting: *Deleted

## 2022-05-28 ENCOUNTER — Emergency Department: Payer: 59

## 2022-05-28 ENCOUNTER — Other Ambulatory Visit: Payer: Self-pay

## 2022-05-28 ENCOUNTER — Emergency Department
Admission: EM | Admit: 2022-05-28 | Discharge: 2022-05-28 | Disposition: A | Discharge status: Home / Self Care | Payer: 59 | Attending: Emergency Medicine | Admitting: Emergency Medicine

## 2022-05-28 DIAGNOSIS — E1065 Type 1 diabetes mellitus with hyperglycemia: Secondary | ICD-10-CM | POA: Diagnosis not present

## 2022-05-28 DIAGNOSIS — R002 Palpitations: Secondary | ICD-10-CM

## 2022-05-28 LAB — BASIC METABOLIC PANEL
Anion gap: 9 (ref 5–15)
BUN: 18 mg/dL (ref 6–20)
CO2: 22 mmol/L (ref 22–32)
Calcium: 9.4 mg/dL (ref 8.9–10.3)
Chloride: 103 mmol/L (ref 98–111)
Creatinine, Ser: 0.88 mg/dL (ref 0.44–1.00)
GFR, Estimated: >60 mL/min (ref >60)
Glucose, Bld: 183 mg/dL — ABNORMAL HIGH (ref 70–99)
Potassium: 3.6 mmol/L (ref 3.5–5.1)
Sodium: 134 mmol/L — ABNORMAL LOW (ref 135–145)

## 2022-05-28 LAB — CBC
HCT: 41 % (ref 36.0–46.0)
Hemoglobin: 13.5 g/dL (ref 12.0–15.0)
MCH: 29.3 pg (ref 26.0–34.0)
MCHC: 32.9 g/dL (ref 30.0–36.0)
MCV: 89.1 fL (ref 80.0–100.0)
Platelets: 363 10*3/uL (ref 150–400)
RBC: 4.6 MIL/uL (ref 3.87–5.11)
RDW: 11.8 % (ref 11.5–15.5)
WBC: 12.7 10*3/uL — ABNORMAL HIGH (ref 4.0–10.5)
nRBC: 0 % (ref 0.0–0.2)

## 2022-05-28 LAB — TSH: TSH: 0.34 u[IU]/mL — ABNORMAL LOW (ref 0.350–4.500)

## 2022-05-28 LAB — TROPONIN I (HIGH SENSITIVITY): Troponin I (High Sensitivity): <2 ng/L (ref <18)

## 2022-05-28 NOTE — Telephone Encounter (Signed)
  Chief Complaint: Fluttering in her chest Symptoms: Feels a fluttering in her chest, heart beating fast and hard, Feels like the wind has been knocked out of her, chest pain under her breast while it's happening and feels disoriented and very hot. Frequency: Intermittently over the last week but today it's been the worst and has happened several times.   Just had an episode just now. Pertinent Negatives: Patient denies passing out or having this problem before.    Disposition: [x]$ ED /[]$ Urgent Care (no appt availability in office) / []$ Appointment(In office/virtual)/ []$  [facility]/ []$ Home Care/ []$ Refused Recommended Disposition /[]$ [facility]/ []$  Follow-up with PCP Additional Notes: Referred her to the ED.    She is agreeable to going.  Sent my notes high priority to Mikey Kirschner, PA-C at [facility].

## 2022-05-28 NOTE — ED Provider Notes (Signed)
[facility] Provider Note    Event Date/Time   First MD Initiated Contact with Patient 05/28/22 1704     (approximate)   History   Palpitations   HPI  Stacy George is a 27 y.o. female with a past medical history of type 1 diabetes, generalized anxiety disorder who presents today for evaluation of palpitations.  Patient reports that she has a history of anxiety and panic attacks.  She was diagnosed with type 1 diabetes 1 year ago and his memory has made her slightly more anxious.  She reports that she knows that she is susceptible to other medical problems given her diabetes and she is concerned that she has another problem going on.  Patient reports that she has had intermittent palpitations over the past several days.  She cannot identify any exacerbating or relieving factors.  She reports that they happen randomly.  She describes the feeling as "feeling out of my body."  She denies any associated chest pain or shortness of breath.  She has not had any lower extremity swelling, or weight fluctuation.  She does not take any exogenous hormones.  She denies any personal or family history of PE or DVT.  She currently does not have any symptoms.  No recent infectious type symptoms.  Patient Active Problem List   Diagnosis Date Noted   Joint stiffness 05/07/2022   Right foot pain 09/10/2021   Genetic testing 08/06/2021   GAD (generalized anxiety disorder) 08/02/2021   Urinary frequency 06/25/2021   Family history of pancreatic cancer 06/25/2021   DKA (diabetic ketoacidosis) ([place]) 06/07/2021   DM (diabetes mellitus), type 1 ([place]) [id number]   Metabolic acidosis [id number]   Migraines 03/27/2021           Physical Exam   Triage Vital Signs: ED Triage Vitals  Enc Vitals Group     BP 05/28/22 1505 125/87     Pulse Rate 05/28/22 1505 99     Resp 05/28/22 1505 16     Temp 05/28/22 1505 98 F (36.7 C)     Temp Source 05/28/22 1505 Oral     SpO2 05/28/22  1505 99 %     Weight 05/28/22 1507 135 lb (61.2 kg)     Height 05/28/22 1507 '5\' 5"'$  (1.651 m)     Head Circumference --      Peak Flow --      Pain Score 05/28/22 1506 4     Pain Loc --      Pain Edu? --      Excl. in [place]? --     Most recent vital signs: Vitals:   05/28/22 1505  BP: 125/87  Pulse: 99  Resp: 16  Temp: 98 F (36.7 C)  SpO2: 99%    Physical Exam Vitals and nursing note reviewed.  Constitutional:      General: Awake and alert. No acute distress.    Appearance: Normal appearance. The patient is normal weight.  HENT:     Head: Normocephalic and atraumatic.     Mouth: Mucous membranes are moist.  Eyes:     General: PERRL. Normal EOMs        Right eye: No discharge.        Left eye: No discharge.     Conjunctiva/sclera: Conjunctivae normal.  Cardiovascular:     Rate and Rhythm: Normal rate and regular rhythm.     Pulses: Normal pulses.     Heart sounds: Normal heart sounds Pulmonary:  Effort: Pulmonary effort is normal. No respiratory distress.     Breath sounds: Normal breath sounds.  Abdominal:     Abdomen is soft. There is no abdominal tenderness. No rebound or guarding. No distention. Musculoskeletal:        General: No swelling. Normal range of motion.     Cervical back: Normal range of motion and neck supple.  Skin:    General: Skin is warm and dry.     Capillary Refill: Capillary refill takes less than 2 seconds.     Findings: No rash.  Neurological:     Mental Status: The patient is awake and alert.      ED Results / Procedures / Treatments   Labs (all labs ordered are listed, but only abnormal results are displayed) Labs Reviewed  BASIC METABOLIC PANEL - Abnormal; Notable for the following components:      Result Value   Sodium 134 (*)    Glucose, Bld 183 (*)    All other components within normal limits  CBC - Abnormal; Notable for the following components:   WBC 12.7 (*)    All other components within normal limits  TSH -  Abnormal; Notable for the following components:   TSH 0.340 (*)    All other components within normal limits  POC URINE PREG, ED  TROPONIN I (HIGH SENSITIVITY)  TROPONIN I (HIGH SENSITIVITY)     EKG     RADIOLOGY I independently reviewed and interpreted imaging and agree with radiologists findings.     PROCEDURES:  Critical Care performed:   Procedures   MEDICATIONS ORDERED IN ED: Medications - No data to display   IMPRESSION / MDM / [place] / ED COURSE  I reviewed the triage vital signs and the nursing notes.   Differential diagnosis includes, but is not limited to, symptomatic anemia, electrolyte disarray, anxiety, dehydration, thyroid dysfunction.  Patient is awake alert, hemodynamically stable and afebrile.  EKG obtained in triage demonstrates normal sinus rhythm with sinus arrhythmia.  No QTc prolongation, no delta wave or epsilon waves noted.  EKG was reviewed by the attending.  Labs are overall reassuring.  She is mildly hyperglycemic, though normal anion gap, normal bicarb, not consistent with diabetic ketoacidosis.  Chest x-ray demonstrates no acute cardiopulmonary abnormality.  Troponin is negative.  No indication for repeat troponin given that the symptoms have been ongoing for several days.  No chest pain, shortness of breath, clinical signs or symptoms of DVT, no hemoptysis, no recent periods of travel or immobilization, no exogenous hormone use to suggest PE as a source of her symptoms today.  Patient does endorse feeling quite anxious.  I recommended that she follow-up with her outpatient provider to discuss the possibility of a Holter monitor to capture any abnormalities when she feels these palpitations.  I also added on a thyroid function test.  We discussed symptomatic management and return precautions.  Patient understands and agrees with plan.  She was discharged in the care of her husband.   Patient's presentation is most consistent with acute  complicated illness / injury requiring diagnostic workup.    FINAL CLINICAL IMPRESSION(S) / ED DIAGNOSES   Final diagnoses:  Heart palpitations     Rx / DC Orders   ED Discharge Orders     None        Note:  This document was prepared using Dragon voice recognition software and may include unintentional dictation errors.   Marquette Old, PA-C 05/28/22 [phone]  Vanessa , MD 05/29/22 [phone]

## 2022-05-28 NOTE — Telephone Encounter (Signed)
Reason for Disposition  [1] Heart beating very rapidly (e.g., > 140 / minute) AND [2] present now  (Exception: During exercise.)  Answer Assessment - Initial Assessment Questions 1. DESCRIPTION: "Please describe your heart rate or heartbeat that you are having" (e.g., fast/slow, regular/irregular, skipped or extra beats, "palpitations")     I'm having fluttering in my chest.   I'm hot and disoriented feeling.   I was driving when it happened.    2. ONSET: "When did it start?" (Minutes, hours or days)      A week.     I'm diabetic so I thought my sugar was low but it was fine.    3. DURATION: "How long does it last" (e.g., seconds, minutes, hours)     A few seconds.    I feel like the wind is knocked out of me.   4. PATTERN "Does it come and go, or has it been constant since it started?"  "Does it get worse with exertion?"   "Are you feeling it now?"     Intermittent.    It's happened several times today.    Underneath my breast  I get a pain.     I just had an episode.    5. TAP: "Using your hand, can you tap out what you are feeling on a chair or table in front of you, so that I can hear?" (Note: not all patients can do this)       Not asked 6. HEART RATE: "Can you tell me your heart rate?" "How many beats in 15 seconds?"  (Note: not all patients can do this)       Not asked 7. RECURRENT SYMPTOM: "Have you ever had this before?" If Yes, ask: "When was the last time?" and "What happened that time?"      It's been intermittent for a week now but today has been the worst.   It's happened several times today.     I just had an episode.  8. CAUSE: "What do you think is causing the palpitations?"     Not asked 9. CARDIAC HISTORY: "Do you have any history of heart disease?" (e.g., heart attack, angina, bypass surgery, angioplasty, arrhythmia)      No 10. OTHER SYMPTOMS: "Do you have any other symptoms?" (e.g., dizziness, chest pain, sweating, difficulty breathing)       I feel like the wind is  knocked out of me when it happens, I'm disoriented and I get real hot. 11. PREGNANCY: "Is there any chance you are pregnant?" "When was your last menstrual period?"       Not asked  Protocols used: Heart Rate and Heartbeat Questions-A-AH

## 2022-05-28 NOTE — ED Triage Notes (Signed)
Pt to ED for "my heart has been fluttering" since last few days intermittently, pt was driving today and it happened and she felt hot and lightheaded "and like everything was moving in slow motion" and had to catch breath. States that about 10 minutes ago pt had L chest tightness. 8/10. Now 4/10.  Pt ambulatory to triage room, currently with unlabored respirations and skin dry. States this has happened but not this bad.   Pt has DM type 1 with omnipod and dexcom.

## 2022-05-28 NOTE — Discharge Instructions (Signed)
Please follow-up with your outpatient provider to discuss the possibility of a Holter monitor.  Please return for any new, worsening, or changing symptoms or other concerns.  It was a pleasure caring for you today.

## 2022-05-29 ENCOUNTER — Encounter: Payer: Self-pay | Admitting: Physician Assistant

## 2022-05-29 ENCOUNTER — Ambulatory Visit: Payer: 59 | Admitting: Physician Assistant

## 2022-05-29 ENCOUNTER — Ambulatory Visit: Payer: 59 | Attending: Physician Assistant

## 2022-05-29 VITALS — BP 106/77 | HR 73 | Temp 97.6°F | Wt 134.2 lb

## 2022-05-29 DIAGNOSIS — R7989 Other specified abnormal findings of blood chemistry: Secondary | ICD-10-CM

## 2022-05-29 DIAGNOSIS — R002 Palpitations: Secondary | ICD-10-CM | POA: Insufficient documentation

## 2022-05-29 NOTE — Assessment & Plan Note (Signed)
W/u in ED was normal aside from low TSH  Pt still experiencing sudden onset palpitations and sob  Repeat tsh/t4, grave's disease markers, d-dimer given SOB and chest tightness  Holter monitor ordered Pt can try TUMS otc or even her xanax to see if this makes a difference in her symptoms ED precautions given

## 2022-05-29 NOTE — Progress Notes (Signed)
I,Connie R Striblin,acting as a Education administrator for [employer], PA-C.,have documented all relevant documentation on the behalf of Mikey Kirschner, PA-C,as directed by  Mikey Kirschner, PA-C while in the presence of Mikey Kirschner, PA-C.   Established patient visit   Patient: Stacy George   DOB: 14-Jul-1995   26 y.o. Female  MRN: [id number] Visit Date: 05/29/2022  Today's healthcare provider: Mikey Kirschner, PA-C   Cc. Heart palpitations   Subjective    HPI  Follow up for Heart Palpitations   The patient was last seen for this 1 days ago- [facility] ED Patient reports that she has had intermittent palpitations over the past several days. She cannot identify any exacerbating or relieving factors. She reports that they happen randomly. She describes the feeling as "feeling out of my body."  She has not had any lower extremity swelling, or weight fluctuation   Patient states that the palpitation have been constant every 10 minutes for the past 2 days, associated symptoms include chest pain and shortness of breath. She describes them as a fluttered with chest tightness. Yesterday they caused some dizziness and feeling of syncope which lead her to the ER. She reports her BS at the time was fine, 120 and responded to soda-- up to 180s.   Denies recent illness or new cough. She is anxious, but was not anxious during the time the palpitations started.   -----------------------------------------------------------------------------------------   Medications: Outpatient Medications Prior to Visit  Medication Sig   albuterol (VENTOLIN HFA) 108 (90 Base) MCG/ACT inhaler Inhale 2 puffs into the lungs every 6 (six) hours as needed for wheezing or shortness of breath.   ALPRAZolam (XANAX) 0.25 MG tablet Take 1-2 tablets (0.25-0.5 mg total) by mouth 2 (two) times daily as needed for anxiety.   Continuous Blood Gluc Sensor (FREESTYLE LIBRE 2 SENSOR) MISC Apply device according to manufacturers  instructions for continuous blood glucose monitoring.   FLUoxetine (PROZAC) 20 MG capsule Take 2 capsules (40 mg total) by mouth daily. Increase to 1.5 tabs for one week. Then increase to 2 tabs.   fluticasone (FLONASE) 50 MCG/ACT nasal spray Place 2 sprays into both nostrils daily.   insulin detemir (LEVEMIR) 100 UNIT/ML FlexPen Inject 10 Units into the skin at bedtime.   insulin lispro (HUMALOG) 200 UNIT/ML KwikPen Before meals: 121-150: 1 unit, 151-200: 2 units, 201-250: 3 units, 251-300: 5 units, 301-350: 7 units, 351-400: 9 units      Bedtime: 121-200: 0 units, 201-250: 2 units, 251-300: 3 units, 301-350: 4 units, 351-400: 5 units   Insulin Pen Needle (PEN NEEDLES) 31G X 5 MM MISC Check blood sugar 4 times daily, before meals, fasting, and before bed   norethindrone (MICRONOR) 0.35 MG tablet Take 1 tablet (0.35 mg total) by mouth daily.   ondansetron (ZOFRAN) 4 MG tablet Take 1 tablet (4 mg total) by mouth every 8 (eight) hours as needed for nausea or vomiting.   Vitamin D, Ergocalciferol, (DRISDOL) 1.25 MG (50000 UNIT) CAPS capsule Take 1 capsule (50,000 Units total) by mouth every 7 (seven) days.   dicyclomine (BENTYL) 10 MG capsule Take 1 capsule (10 mg total) by mouth 4 (four) times daily -  before meals and at bedtime for 5 days.   No facility-administered medications prior to visit.    Review of Systems  Constitutional:  Negative for fatigue and fever.  Respiratory:  Positive for shortness of breath. Negative for cough.   Cardiovascular:  Positive for palpitations. Negative for chest pain and leg  swelling.  Gastrointestinal:  Negative for abdominal pain.  Neurological:  Negative for dizziness and headaches.       Objective    BP 106/77 (BP Location: Right Arm, Patient Position: Sitting, Cuff Size: Normal)   Pulse 73   Temp 97.6 F (36.4 C) (Oral)   Wt 134 lb 3.2 oz (60.9 kg)   LMP 05/22/2022 (Exact Date)   SpO2 100%   BMI 22.33 kg/m  Blood pressure 106/77, pulse 73,  temperature 97.6 F (36.4 C), temperature source Oral, weight 134 lb 3.2 oz (60.9 kg), last menstrual period 05/22/2022, SpO2 100 %.   Physical Exam Constitutional:      General: She is awake.     Appearance: She is well-developed.  HENT:     Head: Normocephalic.  Eyes:     Conjunctiva/sclera: Conjunctivae normal.  Cardiovascular:     Rate and Rhythm: Normal rate and regular rhythm.     Heart sounds: Normal heart sounds.  Pulmonary:     Effort: Pulmonary effort is normal.     Breath sounds: Normal breath sounds.  Skin:    General: Skin is warm.  Neurological:     Mental Status: She is alert and oriented to person, place, and time.  Psychiatric:        Attention and Perception: Attention normal.        Mood and Affect: Mood normal.        Speech: Speech normal.        Behavior: Behavior is cooperative.     No results found for any visits on 05/29/22.  Assessment & Plan     Problem List Items Addressed This Visit       Other   Palpitations - Primary    W/u in ED was normal aside from low TSH  Pt still experiencing sudden onset palpitations and sob  Repeat tsh/t4, grave's disease markers, d-dimer given SOB and chest tightness  Holter monitor ordered Pt can try [place] or even her xanax to see if this makes a difference in her symptoms ED precautions given       Relevant Orders   TSH + free T4   LONG TERM MONITOR (3-14 DAYS)   D-Dimer, Quantitative   Thyrotropin receptor autoabs   Thyroid Stimulating Immunoglobulin   Other Visit Diagnoses     Abnormal thyroid blood test       Relevant Orders   Thyrotropin receptor autoabs   Thyroid Stimulating Immunoglobulin        Return if symptoms worsen or fail to improve, for f/u pending labs.      I, Mikey Kirschner, PA-C have reviewed all documentation for this visit. The documentation on  05/29/22      for the exam, diagnosis, procedures, and orders are all accurate and complete.  Mikey Kirschner, PA-C [facility] [address] [place], [place], [address] Office: [phone] Fax: [place]

## 2022-05-29 NOTE — Telephone Encounter (Signed)
Saw pt in office today

## 2022-05-30 LAB — TSH+FREE T4
Free T4: 1.6 ng/dL (ref 0.82–1.77)
TSH: 0.492 u[IU]/mL (ref 0.450–4.500)

## 2022-05-30 LAB — THYROTROPIN RECEPTOR AUTOABS: Thyrotropin Receptor Ab: <1.1 IU/L (ref 0.00–1.75)

## 2022-05-30 LAB — THYROID STIMULATING IMMUNOGLOBULIN: Thyroid Stim Immunoglobulin: <0.1 IU/L (ref 0.00–0.55)

## 2022-05-30 LAB — D-DIMER, QUANTITATIVE: D-DIMER: <0.2 mg/L FEU (ref 0.00–0.49)

## 2022-06-03 DIAGNOSIS — R002 Palpitations: Secondary | ICD-10-CM | POA: Diagnosis not present

## 2022-06-10 NOTE — Progress Notes (Signed)
NEUROLOGY CONSULTATION NOTE  Stacy George MRN: [id number] DOB: 02-10-1996  Referring provider: Lloyd Huger, CNM Primary care provider: Mikey Kirschner, PA-C  Reason for consult:  headaches  Assessment/Plan:   Migraine with aura, without status migrainosus, not intractable Chronic daily headache Chronic nausea Lightheadedness - She has low blood pressure but orthostatic vitals are negative.   Migraine prevention:  Will try to start Aimovig '140mg'$  every 4 weeks.  I cannot start beta blocker due to baseline low blood pressure and feeling lightheaded.  I do not want to start topiramate as she is already thin and do not want to suppress her appetite that may complicate her diabetes.  She is already on an antidepressant and would rather not add another one. Migraine rescue:  rizatriptan-MLT '10mg'$ .  As she has chronic nausea, a disintegrating tablet may be more effective.  May use ibuprofen if more effective. Limit use of pain relievers to no more than 2 days out of week to prevent risk of rebound or medication-overuse headache. Zofran for nausea Keep headache diary Follow up 5 months.    Subjective:  Stacy George is a 27 year old female with DM I and anxiety who presents for headaches.  History supplemented by referring provider's note.  Patient diagnosed with Type 1 diabetes early last year and was hospitalized last March for DKA.  She has not felt well since then.   She has history of migraines since childhood, which have been more frequent over the past year.  They are a 5-6/10 throbbing pain across the front and sometimes back of her head.  Associated with seeing spots in her vision, nausea, photophobia, phonophobia and osmophobia.  Usually lasts 1 hour and have been occurring 2-3 days a week.  No known triggers.  Laying down to rest in the dark helps.  Movement aggravates them.  For the past year, she has been experiencing a dull persistent pulsating headache on the top of  her head.  She also reports persistent nausea.  She has been diagnosed with gastroenteritis.  She has been experiencing lightheadedness, a sensation that she is going to pass out.  Happens spontaneously, not just with movement, but does happen when she stands up.  Has not actually passed out.  Usually occurs more frequently in the morning.  Not associated with any significant abnormalities of her blood glucose.  CBC without anemia.  BMP without evidence of electrolyte abnormalities.    Past NSAIDS/analgesics:  Tylenol Past abortive triptans:  none Past abortive ergotamine:  none Past muscle relaxants:  none Past anti-emetic:  none Past antihypertensive medications:  none Past antidepressant medications:  none Past anticonvulsant medications:  none Past anti-CGRP:  none Past vitamins/Herbal/Supplements:  none Past antihistamines/decongestants:  none Other past therapies:  none  Current NSAIDS/analgesics:  ibuprofen (3-4 days a week) Current triptans:  none Current ergotamine:  none Current anti-emetic:  Zofran '4mg'$  Current muscle relaxants:  none Current Antihypertensive medications:  none Current Antidepressant medications:  fluoxetine '40mg'$  daily Current Anticonvulsant medications:  none Current anti-CGRP:  none Current Vitamins/Herbal/Supplements:  D Current Antihistamines/Decongestants:  Flonase Other therapy:  none Birth control:  norethindrone Other medications:  alprazolam 0.25-0.'5mg'$  BID PRN (anxiety), Humalog, insulin pump   Caffeine:  Red Bull once in awhile.  Rarely coffee Diet:  32 oz water daily.  Gatorade.  Skips meals Exercise:  at work uses stairs and walks frequently Depression:  no; Anxiety:  yes.  Has panic attacks.  Feels stressed Other pain:  generalized body aches Sleep hygiene:  6-7 hours sleep a night.   Family history of headache:  mom (headaches)      PAST MEDICAL HISTORY: Past Medical History:  Diagnosis Date   Anxiety    Diabetes mellitus  without complication ([place])    DM type 1   Migraines    Periodontal disease    Seasonal allergies     PAST SURGICAL HISTORY: Past Surgical History:  Procedure Laterality Date   KNEE SURGERY     cyst and fluid removed from behind knee cap   NASAL POLYP EXCISION     periodontal surgery     WISDOM TOOTH EXTRACTION      MEDICATIONS: Current Outpatient Medications on File Prior to Visit  Medication Sig Dispense Refill   albuterol (VENTOLIN HFA) 108 (90 Base) MCG/ACT inhaler Inhale 2 puffs into the lungs every 6 (six) hours as needed for wheezing or shortness of breath. 8 g 0   ALPRAZolam (XANAX) 0.25 MG tablet Take 1-2 tablets (0.25-0.5 mg total) by mouth 2 (two) times daily as needed for anxiety. 30 tablet 0   Continuous Blood Gluc Sensor (FREESTYLE LIBRE 2 SENSOR) MISC Apply device according to manufacturers instructions for continuous blood glucose monitoring. 2 each 5   dicyclomine (BENTYL) 10 MG capsule Take 1 capsule (10 mg total) by mouth 4 (four) times daily -  before meals and at bedtime for 5 days. 20 capsule 0   FLUoxetine (PROZAC) 20 MG capsule Take 2 capsules (40 mg total) by mouth daily. Increase to 1.5 tabs for one week. Then increase to 2 tabs. 60 capsule 3   fluticasone (FLONASE) 50 MCG/ACT nasal spray Place 2 sprays into both nostrils daily. 16 g 6   insulin detemir (LEVEMIR) 100 UNIT/ML FlexPen Inject 10 Units into the skin at bedtime. 15 mL 2   insulin lispro (HUMALOG) 200 UNIT/ML KwikPen Before meals: 121-150: 1 unit, 151-200: 2 units, 201-250: 3 units, 251-300: 5 units, 301-350: 7 units, 351-400: 9 units      Bedtime: 121-200: 0 units, 201-250: 2 units, 251-300: 3 units, 301-350: 4 units, 351-400: 5 units 3 mL 11   Insulin Pen Needle (PEN NEEDLES) 31G X 5 MM MISC Check blood sugar 4 times daily, before meals, fasting, and before bed 100 each 3   norethindrone (MICRONOR) 0.35 MG tablet Take 1 tablet (0.35 mg total) by mouth daily. 28 tablet 3   ondansetron (ZOFRAN) 4 MG  tablet Take 1 tablet (4 mg total) by mouth every 8 (eight) hours as needed for nausea or vomiting. 15 tablet 0   Vitamin D, Ergocalciferol, (DRISDOL) 1.25 MG (50000 UNIT) CAPS capsule Take 1 capsule (50,000 Units total) by mouth every 7 (seven) days. 12 capsule 0   No current facility-administered medications on file prior to visit.    ALLERGIES: Allergies  Allergen Reactions   Other     Environmental allergies    FAMILY HISTORY: Family History  Problem Relation Age of Onset   Hypertension Maternal Grandmother    Diabetes Maternal Grandfather    Diabetes Paternal Grandmother    Breast cancer Paternal Grandmother        not sure of age   Pancreatic cancer Paternal Grandmother     Objective:  Blood pressure 108/62, pulse 71, height '5\' 5"'$  (1.651 m), weight 132 lb (59.9 kg), last menstrual period 05/22/2022, SpO2 100 %. General: No acute distress.  Patient appears well-groomed.   Head:  Normocephalic/atraumatic Eyes:  fundi examined but not visualized Neck: supple, no paraspinal tenderness, full range of  motion Heart: regular rate and rhythm Neurological Exam: Mental status: alert and oriented to person, place, and time, speech fluent and not dysarthric, language intact. Cranial nerves: CN I: not tested CN II: pupils equal, round and reactive to light, visual fields intact CN III, IV, VI:  full range of motion, no nystagmus, no ptosis CN V: facial sensation intact. CN VII: upper and lower face symmetric CN VIII: hearing intact CN IX, X: gag intact, uvula midline CN XI: sternocleidomastoid and trapezius muscles intact CN XII: tongue midline Bulk & Tone: normal, no fasciculations. Motor:  muscle strength 5/5 throughout Sensation:  Pinprick, temperature and vibratory sensation intact. Deep Tendon Reflexes:  2+ throughout,  toes downgoing.   Finger to nose testing:  Without dysmetria.   Heel to shin:  Without dysmetria.   Gait:  Normal station and stride.  Romberg  negative.    Thank you for allowing me to take part in the care of this patient.  Metta Clines, DO  CC:  Mikey Kirschner, PA-C  Lurlean Horns, CNM

## 2022-06-11 ENCOUNTER — Ambulatory Visit: Payer: 59 | Admitting: Neurology

## 2022-06-11 ENCOUNTER — Encounter: Payer: Self-pay | Admitting: Neurology

## 2022-06-11 VITALS — BP 108/62 | HR 71 | Ht 65.0 in | Wt 132.0 lb

## 2022-06-11 DIAGNOSIS — G43E09 Chronic migraine with aura, not intractable, without status migrainosus: Secondary | ICD-10-CM | POA: Diagnosis not present

## 2022-06-11 DIAGNOSIS — R110 Nausea: Secondary | ICD-10-CM

## 2022-06-11 DIAGNOSIS — R42 Dizziness and giddiness: Secondary | ICD-10-CM | POA: Diagnosis not present

## 2022-06-11 MED ORDER — AIMOVIG 140 MG/ML ~~LOC~~ SOAJ: 140.0000 mg | SUBCUTANEOUS | 11 refills | Status: DC

## 2022-06-11 MED ORDER — RIZATRIPTAN BENZOATE 10 MG PO TBDP: 10.0000 mg | ORAL_TABLET | ORAL | 11 refills | Status: DC | PRN

## 2022-06-11 NOTE — Patient Instructions (Signed)
  Start Aimovig 140mg  injection every 30 days.   Take rizatriptan at earliest onset of headache.  May repeat dose once in 2 hours if needed.  Maximum 2 tablets in 24 hours.  If ibuprofen better, may use ibuprofen but limit use (see #3) Limit use of pain relievers to no more than 2 days out of the week.  These medications include acetaminophen, NSAIDs (ibuprofen/Advil/Motrin, naproxen/Aleve, triptans (rizatriptan), Excedrin, and narcotics.  This will help reduce risk of rebound headaches. Routine exercise Stay adequately hydrated (aim for 64 oz water daily) Keep headache diary Maintain proper stress management Maintain proper sleep hygiene Do not skip meals Consider supplements:  magnesium citrate 400mg  daily, riboflavin 400mg  daily, coenzyme Q10 300mg  daily. Follow up 5 months.

## 2022-06-17 LAB — HM DIABETES EYE EXAM

## 2022-06-18 ENCOUNTER — Telehealth: Payer: Self-pay | Admitting: Pharmacist

## 2022-06-18 NOTE — Progress Notes (Signed)
   06/18/2022  Patient ID: Stacy George, female   DOB: [birth year], 27 y.o.   MRN: [id number]  Coordination of Care Call  Patient appearing on list for outreach as currently Levemir insulin currently listed on medication list in Epic. Note Novo Nordisk is discontinuing Levemir insulin in 2024 (FlexPen to be discontinued as of 06/30/2022).  Placed coordination of care call to patient's Endocrinologist, Dr. De Hollingshead, office as note per latest chart note dated 06/05/2022 from provider (scanned into Media tab), Levemir listed on medication list with this provider. Per discussion with Endocrinology office today, Dr. Ronnald Collum does not prescribe Levemir insulin for patient. Per note from 06/05/2022 and office, patient using Humalog insulin via Omnipod insulin pump.  Was unable to reach patient via telephone today and have left HIPAA compliant voicemail asking patient to return my call.  Wallace Cullens, PharmD, [facility] [phone]

## 2022-07-03 ENCOUNTER — Telehealth: Payer: Self-pay | Admitting: *Deleted

## 2022-07-03 NOTE — Telephone Encounter (Signed)
Email notification received to [facility] from [place] regarding this patient. Per notification, the patient's ZIO monitor, ordered by Mikey Kirschner, PA [facility]) on 05/29/22 is still pending return to [employer].   Serial #: [id number]  Will forward to [place], [place] as an [place].

## 2022-07-04 NOTE — Telephone Encounter (Signed)
Pt called back, says she did use the heart moniotr, and sent it back thru post office today.

## 2022-07-04 NOTE — Telephone Encounter (Signed)
Noted ty

## 2022-07-04 NOTE — Telephone Encounter (Signed)
Noted  

## 2022-07-04 NOTE — Telephone Encounter (Signed)
LVMTCB. CRM created. Ok for [facility] to verify per Lillia Abed

## 2022-08-01 ENCOUNTER — Other Ambulatory Visit: Payer: Self-pay | Admitting: Physician Assistant

## 2022-08-01 DIAGNOSIS — F411 Generalized anxiety disorder: Secondary | ICD-10-CM

## 2022-08-01 NOTE — Telephone Encounter (Signed)
[facility] requesting prescription refill ALPRAZolam (XANAX) 0.25 MG tablet  Please advise

## 2022-08-04 ENCOUNTER — Telehealth: Payer: Self-pay | Admitting: Physician Assistant

## 2022-08-04 MED ORDER — ALPRAZOLAM 0.25 MG PO TABS: 0.2500 mg | ORAL_TABLET | Freq: Two times a day (BID) | ORAL | 1 refills | Status: DC | PRN

## 2022-08-04 NOTE — Telephone Encounter (Signed)
[facility] faxed refill request for the following medications:    ALPRAZolam (XANAX) 0.25 MG tablet   Please advise

## 2022-08-04 NOTE — Telephone Encounter (Signed)
Sent to pharmacy as: ALPRAZolam Prudy Feeler) 0.25 MG tablet   E-Prescribing Status: Receipt confirmed by pharmacy (08/04/2022 10:32 AM EDT)

## 2022-08-08 ENCOUNTER — Emergency Department: Payer: 59

## 2022-08-08 ENCOUNTER — Ambulatory Visit: Payer: 59 | Admitting: Physician Assistant

## 2022-08-08 ENCOUNTER — Encounter: Payer: Self-pay | Admitting: Physician Assistant

## 2022-08-08 ENCOUNTER — Emergency Department
Admission: EM | Admit: 2022-08-08 | Discharge: 2022-08-08 | Disposition: A | Discharge status: Home / Self Care | Payer: 59 | Attending: Emergency Medicine | Admitting: Emergency Medicine

## 2022-08-08 VITALS — BP 120/84 | HR 75 | Ht 65.0 in | Wt 132.0 lb

## 2022-08-08 DIAGNOSIS — J9801 Acute bronchospasm: Secondary | ICD-10-CM | POA: Diagnosis not present

## 2022-08-08 DIAGNOSIS — R0602 Shortness of breath: Secondary | ICD-10-CM

## 2022-08-08 LAB — CBC
HCT: 36.9 % (ref 36.0–46.0)
Hemoglobin: 12.4 g/dL (ref 12.0–15.0)
MCH: 29.2 pg (ref 26.0–34.0)
MCHC: 33.6 g/dL (ref 30.0–36.0)
MCV: 87 fL (ref 80.0–100.0)
Platelets: 336 10*3/uL (ref 150–400)
RBC: 4.24 MIL/uL (ref 3.87–5.11)
RDW: 11.9 % (ref 11.5–15.5)
WBC: 11.2 10*3/uL — ABNORMAL HIGH (ref 4.0–10.5)
nRBC: 0 % (ref 0.0–0.2)

## 2022-08-08 LAB — POC URINE PREG, ED: Preg Test, Ur: NEGATIVE

## 2022-08-08 LAB — COMPREHENSIVE METABOLIC PANEL
ALT: 16 U/L (ref 0–44)
AST: 16 U/L (ref 15–41)
Albumin: 4.1 g/dL (ref 3.5–5.0)
Alkaline Phosphatase: 60 U/L (ref 38–126)
Anion gap: 8 (ref 5–15)
BUN: 12 mg/dL (ref 6–20)
CO2: 20 mmol/L — ABNORMAL LOW (ref 22–32)
Calcium: 9 mg/dL (ref 8.9–10.3)
Chloride: 105 mmol/L (ref 98–111)
Creatinine, Ser: 0.67 mg/dL (ref 0.44–1.00)
GFR, Estimated: >60 mL/min (ref >60)
Glucose, Bld: 304 mg/dL — ABNORMAL HIGH (ref 70–99)
Potassium: 3.6 mmol/L (ref 3.5–5.1)
Sodium: 133 mmol/L — ABNORMAL LOW (ref 135–145)
Total Bilirubin: 0.7 mg/dL (ref 0.3–1.2)
Total Protein: 6.8 g/dL (ref 6.5–8.1)

## 2022-08-08 LAB — TROPONIN I (HIGH SENSITIVITY): Troponin I (High Sensitivity): <2 ng/L (ref <18)

## 2022-08-08 MED ORDER — IPRATROPIUM-ALBUTEROL 0.5-2.5 (3) MG/3ML IN SOLN
3.0000 mL | Freq: Once | RESPIRATORY_TRACT | Status: AC
  Administered 2022-08-08: 3 mL via RESPIRATORY_TRACT

## 2022-08-08 MED ORDER — IOHEXOL 350 MG/ML SOLN
75.0000 mL | Freq: Once | INTRAVENOUS | Status: AC | PRN
  Administered 2022-08-08: 75 mL via INTRAVENOUS

## 2022-08-08 MED ORDER — ALBUTEROL SULFATE HFA 108 (90 BASE) MCG/ACT IN AERS: 2.0000 | INHALATION_SPRAY | Freq: Four times a day (QID) | RESPIRATORY_TRACT | 2 refills | Status: DC | PRN

## 2022-08-08 MED ORDER — IPRATROPIUM-ALBUTEROL 0.5-2.5 (3) MG/3ML IN SOLN
3.0000 mL | Freq: Once | RESPIRATORY_TRACT | Status: AC
  Administered 2022-08-08: 3 mL via RESPIRATORY_TRACT
  Filled 2022-08-08: qty 6

## 2022-08-08 NOTE — ED Triage Notes (Signed)
Pt sts that she has been having SOB for the last couple of days. Pt sts that she has to yawn really big to feel like she is getting enough air in.

## 2022-08-08 NOTE — Progress Notes (Unsigned)
Established patient visit   Patient: Stacy George   DOB: 11-07-1995   27 y.o. Female  MRN: [id number] Visit Date: 08/08/2022  Today's healthcare provider: [name], PA-C   Chief Complaint  Patient presents with   Shortness of Breath    Pt stated--shortness of breath, tightness of chest, feel like I need yawn--2 days   Subjective    HPI HPI     Shortness of Breath    Additional comments: Pt stated--shortness of breath, tightness of chest, feel like I need yawn--2 days      Last edited by Shelly Bombard, CMA on 08/08/2022  2:57 PM.     Mora Appl Saturday; constant;   Pt reports  Difficutly breathing, sob.  Flights last week x 2 no improvement with xanax rec D Medications: Outpatient Medications Prior to Visit  Medication Sig   ALPRAZolam (XANAX) 0.25 MG tablet Take 1-2 tablets (0.25-0.5 mg total) by mouth 2 (two) times daily as needed for anxiety.   Continuous Blood Gluc Sensor (FREESTYLE LIBRE 2 SENSOR) MISC Apply device according to manufacturers instructions for continuous blood glucose monitoring.   FLUoxetine (PROZAC) 20 MG capsule Take 2 capsules (40 mg total) by mouth daily. Increase to 1.5 tabs for one week. Then increase to 2 tabs.   fluticasone (FLONASE) 50 MCG/ACT nasal spray Place 2 sprays into both nostrils daily.   insulin detemir (LEVEMIR) 100 UNIT/ML FlexPen Inject 10 Units into the skin at bedtime.   insulin lispro (HUMALOG) 200 UNIT/ML KwikPen Before meals: 121-150: 1 unit, 151-200: 2 units, 201-250: 3 units, 251-300: 5 units, 301-350: 7 units, 351-400: 9 units      Bedtime: 121-200: 0 units, 201-250: 2 units, 251-300: 3 units, 301-350: 4 units, 351-400: 5 units   Insulin Pen Needle (PEN NEEDLES) 31G X 5 MM MISC Check blood sugar 4 times daily, before meals, fasting, and before bed   ondansetron (ZOFRAN) 4 MG tablet Take 1 tablet (4 mg total) by mouth every 8 (eight) hours as needed for nausea or vomiting.   rizatriptan (MAXALT-MLT) 10 MG  disintegrating tablet Take 1 tablet (10 mg total) by mouth as needed for migraine. May repeat in 2 hours if needed.  Maximum 2 tablets in 24 hours.   Vitamin D, Ergocalciferol, (DRISDOL) 1.25 MG (50000 UNIT) CAPS capsule Take 1 capsule (50,000 Units total) by mouth every 7 (seven) days.   albuterol (VENTOLIN HFA) 108 (90 Base) MCG/ACT inhaler Inhale 2 puffs into the lungs every 6 (six) hours as needed for wheezing or shortness of breath. (Patient not taking: Reported on 08/08/2022)   Erenumab-aooe (AIMOVIG) 140 MG/ML SOAJ Inject 140 mg into the skin every 30 (thirty) days. (Patient not taking: Reported on 08/08/2022)   norethindrone (MICRONOR) 0.35 MG tablet Take 1 tablet (0.35 mg total) by mouth daily. (Patient not taking: Reported on 08/08/2022)   No facility-administered medications prior to visit.    Review of Systems     Objective    BP 120/84 (BP Location: Right Arm, Patient Position: Sitting, Cuff Size: Normal)   Pulse 75   Ht 5\' 5"  (1.651 m)   Wt 132 lb (59.9 kg)   SpO2 98%   BMI 21.97 kg/m    Physical Exam  ***  No results found for any visits on 08/08/22.  Assessment & Plan     ***  No follow-ups on file.      I, [name], PA-C have reviewed all documentation for this visit. The documentation on  08/08/22   for the exam, diagnosis, procedures, and orders are all accurate and complete.  [name], PA-C [facility] [address] [place], [place], [address] Office: [phone] Fax: [phone]   [facility]

## 2022-08-08 NOTE — ED Provider Notes (Signed)
[facility] Provider Note    Event Date/Time   First MD Initiated Contact with Patient 08/08/22 1635     (approximate)   History   Shortness of Breath   HPI  Stacy George is a 27 y.o. female who presents with complaints of shortness of breath.  Patient feels like she is having difficulty getting a full breath and feels somewhat winded.  She denies a history of asthma.  No fevers or chills, occasional dry cough.  Sent in by PCP for evaluation.     Physical Exam   Triage Vital Signs: ED Triage Vitals  Enc Vitals Group     BP 08/08/22 1544 (!) 153/93     Pulse Rate 08/08/22 1544 75     Resp 08/08/22 1544 18     Temp 08/08/22 1544 97.7 F (36.5 C)     Temp Source 08/08/22 1544 Oral     SpO2 08/08/22 1544 100 %     Weight 08/08/22 1545 59.9 kg (132 lb)     Height --      Head Circumference --      Peak Flow --      Pain Score 08/08/22 1545 6     Pain Loc --      Pain Edu? --      Excl. in GC? --     Most recent vital signs: Vitals:   08/08/22 1544 08/08/22 1901  BP: (!) 153/93 135/77  Pulse: 75 72  Resp: 18 16  Temp: 97.7 F (36.5 C)   SpO2: 100% 100%     General: Awake, no distress.  CV:  Good peripheral perfusion.  Resp:  Normal effort.  No significant wheezing Abd:  No distention.  Other:  No calf pain or swelling   ED Results / Procedures / Treatments   Labs (all labs ordered are listed, but only abnormal results are displayed) Labs Reviewed  CBC - Abnormal; Notable for the following components:      Result Value   WBC 11.2 (*)    All other components within normal limits  COMPREHENSIVE METABOLIC PANEL - Abnormal; Notable for the following components:   Sodium 133 (*)    CO2 20 (*)    Glucose, Bld 304 (*)    All other components within normal limits  POC URINE PREG, ED  TROPONIN I (HIGH SENSITIVITY)     EKG  ED ECG REPORT I, Jene Every, the attending physician, personally viewed and interpreted this  ECG.  Date: 08/08/2022  Rhythm: normal sinus rhythm QRS Axis: normal Intervals: normal ST/T Wave abnormalities: normal Narrative Interpretation: no evidence of acute ischemia    RADIOLOGY Chest x-ray viewed interpreted by me, no pneumothorax or pneumonia    PROCEDURES:  Critical Care performed:   Procedures   MEDICATIONS ORDERED IN ED: Medications  ipratropium-albuterol (DUONEB) 0.5-2.5 (3) MG/3ML nebulizer solution 3 mL (3 mLs Nebulization Given 08/08/22 1702)  ipratropium-albuterol (DUONEB) 0.5-2.5 (3) MG/3ML nebulizer solution 3 mL (3 mLs Nebulization Given 08/08/22 1701)  iohexol (OMNIPAQUE) 350 MG/ML injection 75 mL (75 mLs Intravenous Contrast Given 08/08/22 1804)     IMPRESSION / MDM / ASSESSMENT AND PLAN / ED COURSE  I reviewed the triage vital signs and the nursing notes. Patient's presentation is most consistent with acute presentation with potential threat to life or bodily function.  Patient presents with shortness of breath as detailed above.  Differential includes bronchospasm, pneumonia, less likely PE  With DuoNebs with some improvement, chest x-ray  not consistent with pneumonia.  Lab work is reassuring, normal-CV troponin, no evidence of heart strain.  For CT angiography which is negative for PE or other abnormality.  Will treat with albuterol for presumed bronchospasm, close follow-up with PCP, return precautions discussed.  No indication for admission given reassuring vital signs and workup      FINAL CLINICAL IMPRESSION(S) / ED DIAGNOSES   Final diagnoses:  Bronchospasm     Rx / DC Orders   ED Discharge Orders          Ordered    albuterol (VENTOLIN HFA) 108 (90 Base) MCG/ACT inhaler  Every 6 hours PRN        08/08/22 1854             Note:  This document was prepared using Dragon voice recognition software and may include unintentional dictation errors.   Jene Every, MD 08/08/22 2252

## 2022-08-08 NOTE — ED Provider Triage Note (Signed)
Emergency Medicine Provider Triage Evaluation Note  Stacy George , a 27 y.o. female  was evaluated in triage.  Pt complains of shortness of breath/heaviness on her chest.  Patient states that she feels like she cannot get in a deep breath.  Feels like she has to yawn to take a breath. No cp  Review of Systems  Positive: Shob, chest tightness Negative: CP,   Physical Exam  BP (!) 153/93 (BP Location: Left Arm)   Pulse 75   Temp 97.7 F (36.5 C) (Oral)   Resp 18   Wt 59.9 kg   LMP 08/02/2022 (Exact Date)   SpO2 100%   BMI 21.97 kg/m  Gen:   Awake, no distress   Resp:  Normal effort  MSK:   Moves extremities without difficulty  Other:    Medical Decision Making  Medically screening exam initiated at 3:47 PM.  Appropriate orders placed.  Stacy George was informed that the remainder of the evaluation will be completed by another provider, this initial triage assessment does not replace that evaluation, and the importance of remaining in the ED until their evaluation is complete.  Labs, EKG, xray   Stacy George, [place] 08/08/22 1556

## 2022-08-11 ENCOUNTER — Encounter: Payer: Self-pay | Admitting: Physician Assistant

## 2022-08-13 ENCOUNTER — Telehealth (INDEPENDENT_AMBULATORY_CARE_PROVIDER_SITE_OTHER): Payer: 59 | Admitting: Physician Assistant

## 2022-08-13 ENCOUNTER — Encounter: Payer: Self-pay | Admitting: Physician Assistant

## 2022-08-13 DIAGNOSIS — R0602 Shortness of breath: Secondary | ICD-10-CM | POA: Diagnosis not present

## 2022-08-13 DIAGNOSIS — F411 Generalized anxiety disorder: Secondary | ICD-10-CM

## 2022-08-13 MED ORDER — BUSPIRONE HCL 5 MG PO TABS: 5.0000 mg | ORAL_TABLET | Freq: Two times a day (BID) | ORAL | 3 refills | Status: AC

## 2022-08-13 NOTE — Assessment & Plan Note (Signed)
Paxil 40 mg Adding buspar 5 mg bid prn/daily. Advised starting with daily x 1 week to see if this helps control her anxiety Xanax is still prn panic attack Encouraged therapy appt

## 2022-08-13 NOTE — Progress Notes (Signed)
I,[name],acting as a Neurosurgeon for [employer], PA-C.,have documented all relevant documentation on the behalf of [name], PA-C,as directed by  [name], PA-C while in the presence of [name], PA-C.   MyChart Video Visit    Virtual Visit via Video Note   This format is felt to be most appropriate for this patient at this time. Physical exam was limited by quality of the video and audio technology used for the visit.   Patient location: Work -- pt in car not driving, alone Provider location: [employer]  I discussed the limitations of evaluation and management by telemedicine and the availability of in person appointments. The patient expressed understanding and agreed to proceed.  Patient: Stacy George   DOB: Oct 11, 1995   26 y.o. Female  MRN: [id number] Visit Date: 08/13/2022  Today's healthcare provider: [name], PA-C  Cc. F/u anxiety, ED visit  Subjective    HPI  Anxiety, Follow-up  She feels her anxiety is severe and Improved since last visit.  Symptoms: Yes chest pain Yes difficulty concentrating  Yes dizziness Yes fatigue  Yes feelings of losing control Yes insomnia  Yes irritable No palpitations  Yes panic attacks Yes racing thoughts  Yes shortness of breath Yes sweating  Yes tremors/shakes    GAD-7 Results    08/13/2022    9:53 AM 08/08/2022    3:01 PM 12/04/2021    3:03 PM  GAD-7 Generalized Anxiety Disorder Screening Tool  1. Feeling Nervous, Anxious, or on Edge 2 3 3   2. Not Being Able to Stop or Control Worrying 2 3 3   3. Worrying Too Much About Different Things 2 3 3   4. Trouble Relaxing 2 3 3   5. Being So Restless it's Hard To Sit Still 2 3 3   6. Becoming Easily Annoyed or Irritable 2 3 3   7. Feeling Afraid As If Something Awful Might Happen 2 3 2   Total GAD-7 Score 14 21 20   Difficulty At Work, Home, or Getting  Along With Others? Very difficult Not difficult at all Not difficult at all    PHQ-9  Scores    08/08/2022    3:05 PM 05/29/2022    8:53 AM 05/07/2022    2:25 PM  PHQ9 SCORE ONLY  PHQ-9 Total Score 18 15 13     ---------------------------------------------------------------------------------------------------   Pt reports ED visit was tx with nebulizer tx and albuterol inhaler. Reports marked improvement with inhaler. Recalls hx of childhood asthma.  Medications: Outpatient Medications Prior to Visit  Medication Sig   albuterol (VENTOLIN HFA) 108 (90 Base) MCG/ACT inhaler Inhale 2 puffs into the lungs every 6 (six) hours as needed for wheezing or shortness of breath.   ALPRAZolam (XANAX) 0.25 MG tablet Take 1-2 tablets (0.25-0.5 mg total) by mouth 2 (two) times daily as needed for anxiety.   Continuous Blood Gluc Sensor (FREESTYLE LIBRE 2 SENSOR) MISC Apply device according to manufacturers instructions for continuous blood glucose monitoring.   Erenumab-aooe (AIMOVIG) 140 MG/ML SOAJ Inject 140 mg into the skin every 30 (thirty) days.   FLUoxetine (PROZAC) 20 MG capsule Take 2 capsules (40 mg total) by mouth daily. Increase to 1.5 tabs for one week. Then increase to 2 tabs.   fluticasone (FLONASE) 50 MCG/ACT nasal spray Place 2 sprays into both nostrils daily.   insulin detemir (LEVEMIR) 100 UNIT/ML FlexPen Inject 10 Units into the skin at bedtime.   insulin lispro (HUMALOG) 200 UNIT/ML KwikPen Before meals: 121-150: 1 unit, 151-200: 2 units,  201-250: 3 units, 251-300: 5 units, 301-350: 7 units, 351-400: 9 units      Bedtime: 121-200: 0 units, 201-250: 2 units, 251-300: 3 units, 301-350: 4 units, 351-400: 5 units   Insulin Pen Needle (PEN NEEDLES) 31G X 5 MM MISC Check blood sugar 4 times daily, before meals, fasting, and before bed   norethindrone (MICRONOR) 0.35 MG tablet Take 1 tablet (0.35 mg total) by mouth daily.   ondansetron (ZOFRAN) 4 MG tablet Take 1 tablet (4 mg total) by mouth every 8 (eight) hours as needed for nausea or vomiting.   rizatriptan (MAXALT-MLT) 10 MG  disintegrating tablet Take 1 tablet (10 mg total) by mouth as needed for migraine. May repeat in 2 hours if needed.  Maximum 2 tablets in 24 hours.   Vitamin D, Ergocalciferol, (DRISDOL) 1.25 MG (50000 UNIT) CAPS capsule Take 1 capsule (50,000 Units total) by mouth every 7 (seven) days.   No facility-administered medications prior to visit.    Review of Systems  Constitutional:  Negative for fatigue and fever.  Respiratory:  Negative for cough and shortness of breath.   Cardiovascular:  Negative for chest pain and leg swelling.  Gastrointestinal:  Negative for abdominal pain.  Neurological:  Negative for dizziness and headaches.  Psychiatric/Behavioral:  The patient is nervous/anxious.       Objective    LMP 08/02/2022 (Exact Date)     Physical Exam Constitutional:      Appearance: Normal appearance.  Neurological:     Mental Status: She is oriented to person, place, and time.  Psychiatric:        Mood and Affect: Mood normal.        Behavior: Behavior normal.        Assessment & Plan     Problem List Items Addressed This Visit       Other   GAD (generalized anxiety disorder) - Primary    Paxil 40 mg Adding buspar 5 mg bid prn/daily. Advised starting with daily x 1 week to see if this helps control her anxiety Xanax is still prn panic attack Encouraged therapy appt      Relevant Medications   busPIRone (BUSPAR) 5 MG tablet   Other Visit Diagnoses     Shortness of breath       Relevant Orders   Ambulatory referral to Pulmonology      2. Shortness of breath Given improvement w/ albuterol ,referring to pulm for pfts to eval for asthma  - Ambulatory referral to Pulmonology   Return in about 3 months (around 11/13/2022), or if symptoms worsen or fail to improve, for anxiety.     I discussed the assessment and treatment plan with the patient. The patient was provided an opportunity to ask questions and all were answered. The patient agreed with the plan  and demonstrated an understanding of the instructions.   The patient was advised to call back or seek an in-person evaluation if the symptoms worsen or if the condition fails to improve as anticipated.  I provided 7 minutes of non-face-to-face time during this encounter.  I, [name], PA-C have reviewed all documentation for this visit. The documentation on  08/13/22   for the exam, diagnosis, procedures, and orders are all accurate and complete. [name], PA-C [facility] [address] [place], [place], [address] Office: [phone] Fax: [phone]   [facility]

## 2022-09-18 ENCOUNTER — Ambulatory Visit: Payer: 59 | Admitting: Student in an Organized Health Care Education/Training Program

## 2022-09-18 ENCOUNTER — Encounter: Payer: Self-pay | Admitting: Student in an Organized Health Care Education/Training Program

## 2022-09-18 VITALS — BP 122/68 | HR 107 | Temp 97.7°F | Ht 65.0 in | Wt 131.8 lb

## 2022-09-18 DIAGNOSIS — R0602 Shortness of breath: Secondary | ICD-10-CM

## 2022-09-18 LAB — NITRIC OXIDE: Nitric Oxide: 5

## 2022-09-18 NOTE — Progress Notes (Signed)
Synopsis: Referred in for shortness of breath. by [name], PA-C  Assessment & Plan:   1. SOB (shortness of breath)  She is presenting for the evaluation of shortness of breath in the setting of history of type 1 diabetes as well as asthma.  On exam, her lungs are clear with no evidence of wheeze or other adventitious lung sounds.  Cardiac auscultation is also within normal.  FeNO today was normal at less than 5 and a CT scan was negative for pulmonary embolism or pulmonary pathology in May.  At this point, my suspicion for asthma is low but I will complete the workup with a pulmonary function test to assess for any obstruction (spirometry, lung volumes, DLCO).  Should the pulmonary function testing not show any evidence of asthma, I will consider obtaining a methacholine challenge test after discussing this with the patient.  I will also obtain an echocardiogram to assess for any elevated PA pressures and rule out pulmonary hypertension.  On review of her previous blood work, she has had low CO2 levels on her metabolic panels and I would consider metabolic acidosis as a driver behind shortness of breath should the rest of the workup returned negative.  - Nitric oxide - Pulmonary Function Test [facility] Only; Future - ECHOCARDIOGRAM COMPLETE; Future   Return in about 6 weeks (around 10/30/2022).  I spent 60 minutes caring for this patient today, including preparing to see the patient, obtaining a medical history , reviewing a separately obtained history, performing a medically appropriate examination and/or evaluation, counseling and educating the patient/family/caregiver, ordering medications, tests, or procedures, documenting clinical information in the electronic health record, and independently interpreting results (not separately reported/billed) and communicating results to the patient/family/caregiver  Raechel Chute, MD [facility] 09/18/2022 3:25 PM    End of  visit medications:  No orders of the defined types were placed in this encounter.    Current Outpatient Medications:    albuterol (VENTOLIN HFA) 108 (90 Base) MCG/ACT inhaler, Inhale 2 puffs into the lungs every 6 (six) hours as needed for wheezing or shortness of breath., Disp: 8 g, Rfl: 2   ALPRAZolam (XANAX) 0.25 MG tablet, Take 1-2 tablets (0.25-0.5 mg total) by mouth 2 (two) times daily as needed for anxiety., Disp: 30 tablet, Rfl: 1   busPIRone (BUSPAR) 5 MG tablet, Take 1 tablet (5 mg total) by mouth 2 (two) times daily., Disp: 60 tablet, Rfl: 3   Continuous Blood Gluc Sensor (FREESTYLE LIBRE 2 SENSOR) MISC, Apply device according to manufacturers instructions for continuous blood glucose monitoring., Disp: 2 each, Rfl: 5   Erenumab-aooe (AIMOVIG) 140 MG/ML SOAJ, Inject 140 mg into the skin every 30 (thirty) days., Disp: 1.12 mL, Rfl: 11   FLUoxetine (PROZAC) 20 MG capsule, Take 2 capsules (40 mg total) by mouth daily. Increase to 1.5 tabs for one week. Then increase to 2 tabs., Disp: 60 capsule, Rfl: 3   fluticasone (FLONASE) 50 MCG/ACT nasal spray, Place 2 sprays into both nostrils daily., Disp: 16 g, Rfl: 6   insulin detemir (LEVEMIR) 100 UNIT/ML FlexPen, Inject 10 Units into the skin at bedtime., Disp: 15 mL, Rfl: 2   insulin lispro (HUMALOG) 200 UNIT/ML KwikPen, Before meals: 121-150: 1 unit, 151-200: 2 units, 201-250: 3 units, 251-300: 5 units, 301-350: 7 units, 351-400: 9 units      Bedtime: 121-200: 0 units, 201-250: 2 units, 251-300: 3 units, 301-350: 4 units, 351-400: 5 units, Disp: 3 mL, Rfl: 11   Insulin Pen  Needle (PEN NEEDLES) 31G X 5 MM MISC, Check blood sugar 4 times daily, before meals, fasting, and before bed, Disp: 100 each, Rfl: 3   norethindrone (MICRONOR) 0.35 MG tablet, Take 1 tablet (0.35 mg total) by mouth daily., Disp: 28 tablet, Rfl: 3   ondansetron (ZOFRAN) 4 MG tablet, Take 1 tablet (4 mg total) by mouth every 8 (eight) hours as needed for nausea or vomiting.,  Disp: 15 tablet, Rfl: 0   rizatriptan (MAXALT-MLT) 10 MG disintegrating tablet, Take 1 tablet (10 mg total) by mouth as needed for migraine. May repeat in 2 hours if needed.  Maximum 2 tablets in 24 hours., Disp: 9 tablet, Rfl: 11   Vitamin D, Ergocalciferol, (DRISDOL) 1.25 MG (50000 UNIT) CAPS capsule, Take 1 capsule (50,000 Units total) by mouth every 7 (seven) days., Disp: 12 capsule, Rfl: 0   Subjective:   PATIENT ID: Stacy George GENDER: female DOB: Jun 16, 1995, MRN: [id number]  Chief Complaint  Patient presents with   pulmonary consult    SOB with exertion and occ at rest, some wheezing and dry cough.     HPI  Patient is a pleasant 27 year old female with a past medical history of type 1 diabetes who presents to clinic for the evaluation of shortness of breath.  Patient reports symptoms of shortness of breath that occur with rest and with exertion.  The limbs are exacerbated with exercise, especially going up a flight of stairs.  She denies a wheeze, has a minimal cough, and denies chest pain or chest tightness.  She at times describes a sensation where she cannot get a full breath in.  Patient is also reporting palpitations that have been persistent for the past few months.  She was seen by her primary care physician in May for shortness of breath and sent to the emergency department where workup included blood work as well as a high-resolution CT of the chest with PE protocol (negative for clot).  Patient was given DuoNebs with some improvement and discharged home to follow-up with primary care.  She was recently diagnosed with type 1 diabetes and is currently on insulin pump.  She has a history of palpitations and has had a long-term cardiac monitor without any evidence of arrhythmias.  She reports a history of childhood asthma for which she had required nebulizers/inhalers as a child but not as a teenager or young adult.  Patient is originally from [place] where she grew up, and  moved to [place] 2 years ago.  She reports some cold intolerance while she lived in [place].  She has always had dogs and currently has 2 dogs as well as 1 cat.  She denies any smoking but reports having used cigarettes and vapes in the past.   Ancillary information including prior medications, full medical/surgical/family/social histories, and PFTs (when available) are listed below and have been reviewed.   Review of Systems  Constitutional:  Negative for chills, fever, malaise/fatigue and weight loss.  Respiratory:  Positive for shortness of breath. Negative for cough, hemoptysis, sputum production and wheezing.   Cardiovascular:  Positive for palpitations. Negative for chest pain and leg swelling.     Objective:   Vitals:   09/18/22 1501  BP: 122/68  Pulse: (!) 107  Temp: 97.7 F (36.5 C)  TempSrc: Temporal  SpO2: 100%  Weight: 131 lb 12.8 oz (59.8 kg)  Height: 5\' 5"  (1.651 m)   100% on RA BMI Readings from Last 3 Encounters:  09/18/22 21.93 kg/m  08/08/22 21.97 kg/m  08/08/22 21.97 kg/m   Wt Readings from Last 3 Encounters:  09/18/22 131 lb 12.8 oz (59.8 kg)  08/08/22 132 lb (59.9 kg)  08/08/22 132 lb (59.9 kg)    Physical Exam Constitutional:      Appearance: Normal appearance. She is not ill-appearing.  HENT:     Head: Normocephalic.     Mouth/Throat:     Mouth: Mucous membranes are moist.  Cardiovascular:     Rate and Rhythm: Normal rate and regular rhythm.     Pulses: Normal pulses.     Heart sounds: Normal heart sounds.  Pulmonary:     Effort: Pulmonary effort is normal. No respiratory distress.     Breath sounds: Normal breath sounds. No stridor. No wheezing, rhonchi or rales.  Abdominal:     Palpations: Abdomen is soft.  Musculoskeletal:     Right lower leg: No edema.     Left lower leg: No edema.  Neurological:     General: No focal deficit present.     Mental Status: She is alert and oriented to person, place, and time. Mental  status is at baseline.     Ancillary Information    Past Medical History:  Diagnosis Date   Anxiety    Diabetes mellitus without complication (HCC)    DM type 1   Migraines    Periodontal disease    Seasonal allergies      Family History  Problem Relation Age of Onset   Hypertension Maternal Grandmother    Diabetes Maternal Grandfather    Diabetes Paternal Grandmother    Breast cancer Paternal Grandmother        not sure of age   Pancreatic cancer Paternal Grandmother      Past Surgical History:  Procedure Laterality Date   KNEE SURGERY     cyst and fluid removed from behind knee cap   NASAL POLYP EXCISION     periodontal surgery     WISDOM TOOTH EXTRACTION      Social History   Socioeconomic History   Marital status: Married    Spouse name: Not on file   Number of children: Not on file   Years of education: Not on file   Highest education level: Not on file  Occupational History   Occupation: Personal banker    Employer: [employer]  Tobacco Use   Smoking status: Former    Years: 2    Types: Cigarettes, E-cigarettes    Quit date: 2020    Years since quitting: 4.4   Smokeless tobacco: Never  Vaping Use   Vaping Use: Some days   Start date: 04/01/2019  Substance and Sexual Activity   Alcohol use: Yes    Alcohol/week: 1.0 standard drink of alcohol    Types: 1 Standard drinks or equivalent per week    Comment: 1 month   Drug use: Yes    Types: Marijuana    Comment: for anxiety - had medical card in [place]   Sexual activity: Yes    Partners: Male    Birth control/protection: None, Pill  Other Topics Concern   Not on file  Social History Narrative   Are you right handed or left handed? Right   Are you currently employed ? 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 NO   Who lives with you? Husband   What type of home do you live in: 1 story  or 2 story? 1       Social Determinants of Health   Financial  Resource Strain: Not on file  Food Insecurity: Not on file  Transportation Needs: Not on file  Physical Activity: Not on file  Stress: Not on file  Social Connections: Not on file  Intimate Partner Violence: Not on file     Allergies  Allergen Reactions   Other     Environmental allergies     CBC    Component Value Date/Time   WBC 11.2 (H) 08/08/2022 1547   RBC 4.24 08/08/2022 1547   HGB 12.4 08/08/2022 1547   HGB 12.3 04/10/2022 1446   HCT 36.9 08/08/2022 1547   HCT 37.2 04/10/2022 1446   PLT 336 08/08/2022 1547   PLT 354 04/10/2022 1446   MCV 87.0 08/08/2022 1547   MCV 90 04/10/2022 1446   MCH 29.2 08/08/2022 1547   MCHC 33.6 08/08/2022 1547   RDW 11.9 08/08/2022 1547   RDW 12.1 04/10/2022 1446    Pulmonary Functions Testing Results:     No data to display          Outpatient Medications Prior to Visit  Medication Sig Dispense Refill   albuterol (VENTOLIN HFA) 108 (90 Base) MCG/ACT inhaler Inhale 2 puffs into the lungs every 6 (six) hours as needed for wheezing or shortness of breath. 8 g 2   ALPRAZolam (XANAX) 0.25 MG tablet Take 1-2 tablets (0.25-0.5 mg total) by mouth 2 (two) times daily as needed for anxiety. 30 tablet 1   busPIRone (BUSPAR) 5 MG tablet Take 1 tablet (5 mg total) by mouth 2 (two) times daily. 60 tablet 3   Continuous Blood Gluc Sensor (FREESTYLE LIBRE 2 SENSOR) MISC Apply device according to manufacturers instructions for continuous blood glucose monitoring. 2 each 5   Erenumab-aooe (AIMOVIG) 140 MG/ML SOAJ Inject 140 mg into the skin every 30 (thirty) days. 1.12 mL 11   FLUoxetine (PROZAC) 20 MG capsule Take 2 capsules (40 mg total) by mouth daily. Increase to 1.5 tabs for one week. Then increase to 2 tabs. 60 capsule 3   fluticasone (FLONASE) 50 MCG/ACT nasal spray Place 2 sprays into both nostrils daily. 16 g 6   insulin detemir (LEVEMIR) 100 UNIT/ML FlexPen Inject 10 Units into the skin at bedtime. 15 mL 2   insulin lispro (HUMALOG) 200  UNIT/ML KwikPen Before meals: 121-150: 1 unit, 151-200: 2 units, 201-250: 3 units, 251-300: 5 units, 301-350: 7 units, 351-400: 9 units      Bedtime: 121-200: 0 units, 201-250: 2 units, 251-300: 3 units, 301-350: 4 units, 351-400: 5 units 3 mL 11   Insulin Pen Needle (PEN NEEDLES) 31G X 5 MM MISC Check blood sugar 4 times daily, before meals, fasting, and before bed 100 each 3   norethindrone (MICRONOR) 0.35 MG tablet Take 1 tablet (0.35 mg total) by mouth daily. 28 tablet 3   ondansetron (ZOFRAN) 4 MG tablet Take 1 tablet (4 mg total) by mouth every 8 (eight) hours as needed for nausea or vomiting. 15 tablet 0   rizatriptan (MAXALT-MLT) 10 MG disintegrating tablet Take 1 tablet (10 mg total) by mouth as needed for migraine. May repeat in 2 hours if needed.  Maximum 2 tablets in 24 hours. 9 tablet 11   Vitamin D, Ergocalciferol, (DRISDOL) 1.25 MG (50000 UNIT) CAPS capsule Take 1 capsule (50,000 Units total) by mouth every 7 (seven) days. 12 capsule 0   No facility-administered medications prior to visit.

## 2022-09-24 ENCOUNTER — Encounter: Payer: Self-pay | Admitting: Physician Assistant

## 2022-10-09 ENCOUNTER — Emergency Department
Admission: EM | Admit: 2022-10-09 | Discharge: 2022-10-09 | Disposition: A | Discharge status: Home / Self Care | Payer: 59 | Attending: Student in an Organized Health Care Education/Training Program | Admitting: Student in an Organized Health Care Education/Training Program

## 2022-10-09 ENCOUNTER — Emergency Department: Payer: 59

## 2022-10-09 ENCOUNTER — Other Ambulatory Visit: Payer: Self-pay

## 2022-10-09 DIAGNOSIS — J45901 Unspecified asthma with (acute) exacerbation: Secondary | ICD-10-CM | POA: Diagnosis not present

## 2022-10-09 DIAGNOSIS — R0602 Shortness of breath: Secondary | ICD-10-CM

## 2022-10-09 DIAGNOSIS — E1065 Type 1 diabetes mellitus with hyperglycemia: Secondary | ICD-10-CM | POA: Insufficient documentation

## 2022-10-09 DIAGNOSIS — R739 Hyperglycemia, unspecified: Secondary | ICD-10-CM

## 2022-10-09 LAB — COMPREHENSIVE METABOLIC PANEL
ALT: 12 U/L (ref 0–44)
AST: 15 U/L (ref 15–41)
Albumin: 4.2 g/dL (ref 3.5–5.0)
Alkaline Phosphatase: 60 U/L (ref 38–126)
Anion gap: 9 (ref 5–15)
BUN: 9 mg/dL (ref 6–20)
CO2: 20 mmol/L — ABNORMAL LOW (ref 22–32)
Calcium: 9.1 mg/dL (ref 8.9–10.3)
Chloride: 108 mmol/L (ref 98–111)
Creatinine, Ser: 0.51 mg/dL (ref 0.44–1.00)
GFR, Estimated: >60 mL/min (ref >60)
Glucose, Bld: 172 mg/dL — ABNORMAL HIGH (ref 70–99)
Potassium: 3.6 mmol/L (ref 3.5–5.1)
Sodium: 137 mmol/L (ref 135–145)
Total Bilirubin: 1.1 mg/dL (ref 0.3–1.2)
Total Protein: 7 g/dL (ref 6.5–8.1)

## 2022-10-09 LAB — CBC
HCT: 39.2 % (ref 36.0–46.0)
Hemoglobin: 13.2 g/dL (ref 12.0–15.0)
MCH: 29.5 pg (ref 26.0–34.0)
MCHC: 33.7 g/dL (ref 30.0–36.0)
MCV: 87.5 fL (ref 80.0–100.0)
Platelets: 361 10*3/uL (ref 150–400)
RBC: 4.48 MIL/uL (ref 3.87–5.11)
RDW: 11.9 % (ref 11.5–15.5)
WBC: 10.2 10*3/uL (ref 4.0–10.5)
nRBC: 0 % (ref 0.0–0.2)

## 2022-10-09 LAB — TROPONIN I (HIGH SENSITIVITY): Troponin I (High Sensitivity): <2 ng/L (ref <18)

## 2022-10-09 MED ORDER — ALBUTEROL SULFATE HFA 108 (90 BASE) MCG/ACT IN AERS: 2.0000 | INHALATION_SPRAY | RESPIRATORY_TRACT | Status: DC | PRN

## 2022-10-09 MED ORDER — IPRATROPIUM-ALBUTEROL 0.5-2.5 (3) MG/3ML IN SOLN
3.0000 mL | Freq: Once | RESPIRATORY_TRACT | Status: AC
  Administered 2022-10-09: 3 mL via RESPIRATORY_TRACT
  Filled 2022-10-09: qty 3

## 2022-10-09 NOTE — ED Triage Notes (Signed)
Pt here with SOB for months but getting worse today. Pt states she has an albuterol inhaler that she has been using with no relief. Pt states pain across her chest, not radiating. Pt endorses nausea but no vomiting or diarrhea.

## 2022-10-09 NOTE — ED Provider Notes (Signed)
[facility] Provider Note    Event Date/Time   First MD Initiated Contact with Patient 10/09/22 1400     (approximate)   History   Shortness of Breath   HPI  Stacy George is a 27 y.o. female with a past medical history of anxiety, DKA, diabetes who presents today for evaluation of shortness of breath.  Patient reports that this has been ongoing for the last couple of months.  She feels like she is wheezing.  She denies chest pain or back pain.  She has not had any calf pain or leg swelling.  She does not take any sort of hormones including birth control pills.  She denies personal or family history of PE or DVT.  She has an albuterol which she uses at home, though this is not helping her symptoms.  She reports that she occasionally smokes, vapes, and uses marijuana.  Patient Active Problem List   Diagnosis Date Noted   Palpitations 05/29/2022   Joint stiffness 05/07/2022   Right foot pain 09/10/2021   Genetic testing 08/06/2021   GAD (generalized anxiety disorder) 08/02/2021   Urinary frequency 06/25/2021   Family history of pancreatic cancer 06/25/2021   DKA (diabetic ketoacidosis) (HCC) 06/07/2021   DM (diabetes mellitus), type 1 (HCC) 06/06/2021   Metabolic acidosis 06/06/2021   Migraines 03/27/2021          Physical Exam   Triage Vital Signs: ED Triage Vitals  Encounter Vitals Group     BP 10/09/22 1243 (!) 128/95     Systolic BP Percentile --      Diastolic BP Percentile --      Pulse Rate 10/09/22 1243 71     Resp 10/09/22 1243 18     Temp 10/09/22 1243 98 F (36.7 C)     Temp src --      SpO2 10/09/22 1243 100 %     Weight 10/09/22 1240 131 lb 13.4 oz (59.8 kg)     Height 10/09/22 1240 5\' 5"  (1.651 m)     Head Circumference --      Peak Flow --      Pain Score 10/09/22 1240 7     Pain Loc --      Pain Education --      Exclude from Growth Chart --     Most recent vital signs: Vitals:   10/09/22 1243  BP: (!) 128/95   Pulse: 71  Resp: 18  Temp: 98 F (36.7 C)  SpO2: 100%    Physical Exam Vitals and nursing note reviewed.  Constitutional:      General: Awake and alert. No acute distress.    Appearance: Normal appearance. The patient is normal weight.  HENT:     Head: Normocephalic and atraumatic.     Mouth: Mucous membranes are moist.  Eyes:     General: PERRL. Normal EOMs        Right eye: No discharge.        Left eye: No discharge.     Conjunctiva/sclera: Conjunctivae normal.  Cardiovascular:     Rate and Rhythm: Normal rate and regular rhythm.     Pulses: Normal pulses.     Heart sounds: Normal heart sounds Pulmonary:     Effort: Pulmonary effort is normal. No respiratory distress.  No accessory muscle use    Breath sounds: Faint expiratory wheezes bilaterally Abdominal:     Abdomen is soft. There is no abdominal tenderness. No rebound or guarding.  No distention. Musculoskeletal:        General: No swelling. Normal range of motion.     Cervical back: Normal range of motion and neck supple.  Skin:    General: Skin is warm and dry.     Capillary Refill: Capillary refill takes less than 2 seconds.     Findings: No rash.  Neurological:     Mental Status: The patient is awake and alert.      ED Results / Procedures / Treatments   Labs (all labs ordered are listed, but only abnormal results are displayed) Labs Reviewed  COMPREHENSIVE METABOLIC PANEL - Abnormal; Notable for the following components:      Result Value   CO2 20 (*)    Glucose, Bld 172 (*)    All other components within normal limits  CBC  TROPONIN I (HIGH SENSITIVITY)  TROPONIN I (HIGH SENSITIVITY)     EKG     RADIOLOGY I independently reviewed and interpreted imaging and agree with radiologists findings.     PROCEDURES:  Critical Care performed:   Procedures   MEDICATIONS ORDERED IN ED: Medications  ipratropium-albuterol (DUONEB) 0.5-2.5 (3) MG/3ML nebulizer solution 3 mL (3 mLs  Nebulization Given 10/09/22 1423)     IMPRESSION / MDM / ASSESSMENT AND PLAN / ED COURSE  I reviewed the triage vital signs and the nursing notes.   Differential diagnosis includes, but is not limited to, asthma exacerbation, COPD, acute coronary syndrome, pulmonary embolism, bronchitis, pneumonia.  Patient is awake and alert, hemodynamically stable and afebrile.  She has a normal oxygen saturation of 100% on room air.  She is able to speak easily in complete sentences and demonstrates no increased work of breathing.  Blood work obtained in triage is overall reassuring.  She has a negative troponin.  She is PERC negative and Wells score is 0 for pulmonary embolism.  Chest x-ray reveals no acute cardiopulmonary abnormality.  Patient is reassured by these findings.  She was noted to be hyperglycemic, though normal bicarb, no increased anion gap, not consistent with DKA.  She was treated symptomatically with 1 DuoNeb given her expiratory wheezes.  Patient reported feeling significantly improved after administration of the neb.  She maintained a normal oxygen saturation of 100% the entirety of her time in the emergency department.  She reports that she has a follow-up appointment already scheduled with pulmonology, recommended that she keep this appointment.  Will not give steroids at this time given her history of type 1 diabetes.  She also did not require multiple nebs, therefore no indication for steroids at this time.  We did discuss strict return precautions and importance of close outpatient follow-up.  I advised that she stop smoking, vaping, and using marijuana as these are all irritating to the lungs and may cause other problems as well.  Patient understands and agrees with plan.  She was discharged in stable condition with her significant other.   Patient's presentation is most consistent with exacerbation of chronic illness.   Clinical Course as of 10/09/22 1459  Thu Oct 09, 2022  1445  Patient reports that she feels significantly improved after the nebulizer [JP]    Clinical Course User Index [JP] Iceis Knab, Herb Grays, PA-C     FINAL CLINICAL IMPRESSION(S) / ED DIAGNOSES   Final diagnoses:  SOB (shortness of breath)  Mild asthma with exacerbation, unspecified whether persistent  Hyperglycemia     Rx / DC Orders   ED Discharge Orders  None        Note:  This document was prepared using Dragon voice recognition software and may include unintentional dictation errors.   Keturah Shavers 10/09/22 1459    Willy Eddy, MD 10/09/22 1524

## 2022-10-09 NOTE — Discharge Instructions (Signed)
Please follow-up with pulmonology as you have scheduled.  Please return for any new, worsening, or change in symptoms or other concerns.  Your blood work, EKG, chest x-ray were normal today. It was a pleasure caring for you today.

## 2022-10-23 ENCOUNTER — Telehealth: Payer: Self-pay

## 2022-10-23 ENCOUNTER — Telehealth: Payer: 59 | Admitting: Student in an Organized Health Care Education/Training Program

## 2022-10-23 ENCOUNTER — Ambulatory Visit: Payer: 59 | Admitting: Student in an Organized Health Care Education/Training Program

## 2022-10-23 ENCOUNTER — Ambulatory Visit (HOSPITAL_BASED_OUTPATIENT_CLINIC_OR_DEPARTMENT_OTHER): Payer: 59

## 2022-10-23 ENCOUNTER — Ambulatory Visit
Admission: RE | Admit: 2022-10-23 | Discharge: 2022-10-23 | Disposition: A | Discharge status: Home / Self Care | Payer: 59 | Source: Ambulatory Visit | Attending: Student in an Organized Health Care Education/Training Program | Admitting: Student in an Organized Health Care Education/Training Program

## 2022-10-23 DIAGNOSIS — R0602 Shortness of breath: Secondary | ICD-10-CM | POA: Insufficient documentation

## 2022-10-23 DIAGNOSIS — E119 Type 2 diabetes mellitus without complications: Secondary | ICD-10-CM | POA: Diagnosis not present

## 2022-10-23 LAB — ECHOCARDIOGRAM COMPLETE
AR max vel: 2.67 cm2
AV Area VTI: 2.47 cm2
AV Area mean vel: 2.45 cm2
AV Mean grad: 3 mmHg
AV Peak grad: 4.5 mmHg
Ao pk vel: 1.06 m/s
Area-P 1/2: 4.31 cm2
MV VTI: 3.12 cm2
S' Lateral: 3 cm

## 2022-10-23 MED ORDER — AIRSUPRA 90-80 MCG/ACT IN AERO: 2.0000 | INHALATION_SPRAY | RESPIRATORY_TRACT | Status: DC | PRN

## 2022-10-23 MED ORDER — ALBUTEROL SULFATE (2.5 MG/3ML) 0.083% IN NEBU
2.5000 mg | INHALATION_SOLUTION | Freq: Once | RESPIRATORY_TRACT | Status: AC
  Administered 2022-10-23: 2.5 mg via RESPIRATORY_TRACT
  Filled 2022-10-23: qty 3

## 2022-10-23 NOTE — Telephone Encounter (Signed)
I spoke with the and gave her the PFT results and rescheduled her appt to 11/04/2022 at 8:30am. I have left a sample of the Airsupra at the desk for her to pick up.  Nothing further needed.

## 2022-10-23 NOTE — Telephone Encounter (Signed)
Per secure chart from Dr. Aundria Rud, patient has an appt today, but her Echo results are not back yet. We should reschedule her appt to August or Sept. Her PFT's are consistent with asthma. It's showing bronchial hyperreactivity, with 22% improvement, consistent with asthma. She would benefit from and inhaler. We can give her an Airsupra sample until she sees me on 11/04/2022.    ATC the patient. LVM for the patient to return my call.

## 2022-10-23 NOTE — Progress Notes (Signed)
*  PRELIMINARY RESULTS* Echocardiogram 2D Echocardiogram has been performed.  Stacy George 10/23/2022, 9:59 AM

## 2022-10-24 NOTE — Progress Notes (Unsigned)
Virtual Visit via Video Note  I connected with [name] on 10/27/22 at  4:00 PM EDT by a video enabled telemedicine application and verified that I am speaking with the correct person using two identifiers.  Location: Patient: work Provider: [facility]     Established patient visit   Patient: Stacy George   DOB: 1996-03-31   27 y.o. Female  MRN: [id number] Visit Date: 10/27/2022  Today's healthcare provider: [name], PA-C   Cc. Anxiety f.u  Subjective    HPI   Anxiety, Follow-up  Pt feels her anxiety is stable, post wedding and figuring out her health issues that things are improving.  Pt's FMLA w/ work is due for a review, she feels comfortable with her current schedule of ~5 days a month for anxiety, DM I related health issues.   GAD-7 Results    10/27/2022    3:33 PM 08/13/2022    9:53 AM 08/08/2022    3:01 PM  GAD-7 Generalized Anxiety Disorder Screening Tool  1. Feeling Nervous, Anxious, or on Edge 2 2 3   2. Not Being Able to Stop or Control Worrying 2 2 3   3. Worrying Too Much About Different Things 2 2 3   4. Trouble Relaxing 2 2 3   5. Being So Restless it's Hard To Sit Still 3 2 3   6. Becoming Easily Annoyed or Irritable 3 2 3   7. Feeling Afraid As If Something Awful Might Happen 3 2 3   Total GAD-7 Score 17 14 21   Difficulty At Work, Home, or Getting  Along With Others?  Very difficult Not difficult at all    PHQ-9 Scores    08/08/2022    3:05 PM 05/29/2022    8:53 AM 05/07/2022    2:25 PM  PHQ9 SCORE ONLY  PHQ-9 Total Score 18 15 13     ---------------------------------------------------------------------------------------------------  Medications: Outpatient Medications Prior to Visit  Medication Sig   Albuterol-Budesonide (AIRSUPRA) 90-80 MCG/ACT AERO Inhale 2 puffs into the lungs every 4 (four) hours as needed.   ALPRAZolam (XANAX) 0.25 MG tablet Take 1-2 tablets (0.25-0.5 mg total) by mouth 2 (two) times daily as needed for  anxiety.   busPIRone (BUSPAR) 5 MG tablet Take 1 tablet (5 mg total) by mouth 2 (two) times daily.   Continuous Blood Gluc Sensor (FREESTYLE LIBRE 2 SENSOR) MISC Apply device according to manufacturers instructions for continuous blood glucose monitoring.   Erenumab-aooe (AIMOVIG) 140 MG/ML SOAJ Inject 140 mg into the skin every 30 (thirty) days.   FLUoxetine (PROZAC) 20 MG capsule Take 2 capsules (40 mg total) by mouth daily. Increase to 1.5 tabs for one week. Then increase to 2 tabs.   fluticasone (FLONASE) 50 MCG/ACT nasal spray Place 2 sprays into both nostrils daily.   insulin detemir (LEVEMIR) 100 UNIT/ML FlexPen Inject 10 Units into the skin at bedtime.   insulin lispro (HUMALOG) 200 UNIT/ML KwikPen Before meals: 121-150: 1 unit, 151-200: 2 units, 201-250: 3 units, 251-300: 5 units, 301-350: 7 units, 351-400: 9 units      Bedtime: 121-200: 0 units, 201-250: 2 units, 251-300: 3 units, 301-350: 4 units, 351-400: 5 units   Insulin Pen Needle (PEN NEEDLES) 31G X 5 MM MISC Check blood sugar 4 times daily, before meals, fasting, and before bed   norethindrone (MICRONOR) 0.35 MG tablet Take 1 tablet (0.35 mg total) by mouth daily.   ondansetron (ZOFRAN) 4 MG tablet Take 1 tablet (4 mg total) by mouth every 8 (eight) hours as needed  for nausea or vomiting.   rizatriptan (MAXALT-MLT) 10 MG disintegrating tablet Take 1 tablet (10 mg total) by mouth as needed for migraine. May repeat in 2 hours if needed.  Maximum 2 tablets in 24 hours.   Vitamin D, Ergocalciferol, (DRISDOL) 1.25 MG (50000 UNIT) CAPS capsule Take 1 capsule (50,000 Units total) by mouth every 7 (seven) days.   [DISCONTINUED] albuterol (VENTOLIN HFA) 108 (90 Base) MCG/ACT inhaler Inhale 2 puffs into the lungs every 6 (six) hours as needed for wheezing or shortness of breath. (Patient not taking: Reported on 10/27/2022)   No facility-administered medications prior to visit.      Objective    LMP 10/02/2022 (Exact Date)    Physical  Exam Constitutional:      Appearance: Normal appearance. She is not ill-appearing.  Neurological:     Mental Status: She is oriented to person, place, and time.  Psychiatric:        Mood and Affect: Mood normal.        Behavior: Behavior normal.     No results found for any visits on 10/27/22.  Assessment & Plan     Problem List Items Addressed This Visit       Endocrine   DM (diabetes mellitus), type 1 (HCC) (Chronic)    Stable, pt sees endo and is managing well  Per fmla, ~ 5 days a month in case of symptoms from consequences of DM I ie hypoglycemia        Other   GAD (generalized anxiety disorder) - Primary    Stable Cont prozac 40 mg and xanax 0.25 mg 1-2 tablets prn        Return in about 6 months (around 04/29/2023) for CPE.      I, [name], PA-C have reviewed all documentation for this visit. The documentation on  10/27/22   for the exam, diagnosis, procedures, and orders are all accurate and complete.   I discussed the assessment and treatment plan with the patient. The patient was provided an opportunity to ask questions and all were answered. The patient agreed with the plan and demonstrated an understanding of the instructions.   The patient was advised to call back or seek an in-person evaluation if the symptoms worsen or if the condition fails to improve as anticipated.  I provided 10 minutes of non-face-to-face time during this encounter.  [name], PA-C  [facility] at [facility] [phone] (phone) [phone] (fax)  [facility]

## 2022-10-27 ENCOUNTER — Encounter: Payer: Self-pay | Admitting: Physician Assistant

## 2022-10-27 ENCOUNTER — Telehealth (INDEPENDENT_AMBULATORY_CARE_PROVIDER_SITE_OTHER): Payer: 59 | Admitting: Physician Assistant

## 2022-10-27 DIAGNOSIS — E109 Type 1 diabetes mellitus without complications: Secondary | ICD-10-CM

## 2022-10-27 DIAGNOSIS — F411 Generalized anxiety disorder: Secondary | ICD-10-CM

## 2022-10-27 NOTE — Assessment & Plan Note (Signed)
Stable Cont prozac 40 mg and xanax 0.25 mg 1-2 tablets prn

## 2022-10-27 NOTE — Assessment & Plan Note (Signed)
Stable, pt sees endo and is managing well  Per fmla, ~ 5 days a month in case of symptoms from consequences of DM I ie hypoglycemia

## 2022-11-04 ENCOUNTER — Ambulatory Visit: Payer: 59 | Admitting: Student in an Organized Health Care Education/Training Program

## 2022-11-04 ENCOUNTER — Encounter: Payer: Self-pay | Admitting: Student in an Organized Health Care Education/Training Program

## 2022-11-04 VITALS — BP 124/70 | HR 89 | Temp 97.7°F | Ht 66.0 in | Wt 134.4 lb

## 2022-11-04 DIAGNOSIS — J4540 Moderate persistent asthma, uncomplicated: Secondary | ICD-10-CM

## 2022-11-04 MED ORDER — BUDESONIDE-FORMOTEROL FUMARATE 80-4.5 MCG/ACT IN AERO
2.0000 | INHALATION_SPRAY | Freq: Two times a day (BID) | RESPIRATORY_TRACT | 12 refills | Status: AC
Start: 2022-11-04 — End: ?

## 2022-11-04 MED ORDER — AIRSUPRA 90-80 MCG/ACT IN AERO
2.0000 | INHALATION_SPRAY | RESPIRATORY_TRACT | 11 refills | Status: AC | PRN
Start: 2022-11-04 — End: ?

## 2022-11-04 NOTE — Patient Instructions (Signed)
Today, I ordered blood work. You can get them draw at your preferred [facility] draw station. The nearest one to [facility] is at nearby [facility] ([address], [place], [place] [address]). 

## 2022-11-04 NOTE — Progress Notes (Signed)
Synopsis: Referred in for shortness of breath by [name], PA-C  Assessment & Plan:   1. Moderate persistent asthma without complication  She is presenting for the evaluation of shortness of breath in the setting of history of type 1 diabetes as well as asthma.  On exam, her lungs are clear with no evidence of wheeze or other adventitious lung sounds.  Cardiac auscultation is also within normal.  FeNO during our last visit was normal at less than 5 and a CT scan was negative for pulmonary embolism or pulmonary pathology in May.  PFT's obtained prior to today's visit are consistent with an obstructive process with significant reversibility. Patient reported improvement with Paulene Floor and I will initiate LABA/ICS for controller medicine (Symbicort). Furthermore, echocardiogram performed prior to this visit was within normal. Will obtain a CBC with differential and allergen panel to assess for t-helper cell type response given exposure to pets at home.   - CBC with Differential/Platelet - Allergen Panel (27) + IGE - budesonide-formoterol (SYMBICORT) 80-4.5 MCG/ACT inhaler; Inhale 2 puffs into the lungs in the morning and at bedtime.  Dispense: 1 each; Refill: 12 - Albuterol-Budesonide (AIRSUPRA) 90-80 MCG/ACT AERO; Inhale 2 puffs into the lungs every 4 (four) hours as needed.  Dispense: 10.7 g; Refill: 11   Return in about 3 months (around 02/04/2023).  I spent 30 minutes caring for this patient today, including preparing to see the patient, obtaining a medical history , reviewing a separately obtained history, performing a medically appropriate examination and/or evaluation, counseling and educating the patient/family/caregiver, ordering medications, tests, or procedures, and documenting clinical information in the electronic health record  Raechel Chute, MD [facility] 11/04/2022 8:58 AM    End of visit medications:  Meds ordered this encounter  Medications    budesonide-formoterol (SYMBICORT) 80-4.5 MCG/ACT inhaler    Sig: Inhale 2 puffs into the lungs in the morning and at bedtime.    Dispense:  1 each    Refill:  12   Albuterol-Budesonide (AIRSUPRA) 90-80 MCG/ACT AERO    Sig: Inhale 2 puffs into the lungs every 4 (four) hours as needed.    Dispense:  10.7 g    Refill:  11     Current Outpatient Medications:    Albuterol-Budesonide (AIRSUPRA) 90-80 MCG/ACT AERO, Inhale 2 puffs into the lungs every 4 (four) hours as needed., Disp: 10.7 g, Rfl: 11   ALPRAZolam (XANAX) 0.25 MG tablet, Take 1-2 tablets (0.25-0.5 mg total) by mouth 2 (two) times daily as needed for anxiety., Disp: 30 tablet, Rfl: 1   budesonide-formoterol (SYMBICORT) 80-4.5 MCG/ACT inhaler, Inhale 2 puffs into the lungs in the morning and at bedtime., Disp: 1 each, Rfl: 12   busPIRone (BUSPAR) 5 MG tablet, Take 1 tablet (5 mg total) by mouth 2 (two) times daily., Disp: 60 tablet, Rfl: 3   Continuous Blood Gluc Sensor (FREESTYLE LIBRE 2 SENSOR) MISC, Apply device according to manufacturers instructions for continuous blood glucose monitoring., Disp: 2 each, Rfl: 5   Erenumab-aooe (AIMOVIG) 140 MG/ML SOAJ, Inject 140 mg into the skin every 30 (thirty) days., Disp: 1.12 mL, Rfl: 11   FLUoxetine (PROZAC) 20 MG capsule, Take 2 capsules (40 mg total) by mouth daily. Increase to 1.5 tabs for one week. Then increase to 2 tabs., Disp: 60 capsule, Rfl: 3   fluticasone (FLONASE) 50 MCG/ACT nasal spray, Place 2 sprays into both nostrils daily., Disp: 16 g, Rfl: 6   insulin detemir (LEVEMIR) 100 UNIT/ML FlexPen, Inject 10 Units  into the skin at bedtime., Disp: 15 mL, Rfl: 2   insulin lispro (HUMALOG) 200 UNIT/ML KwikPen, Before meals: 121-150: 1 unit, 151-200: 2 units, 201-250: 3 units, 251-300: 5 units, 301-350: 7 units, 351-400: 9 units      Bedtime: 121-200: 0 units, 201-250: 2 units, 251-300: 3 units, 301-350: 4 units, 351-400: 5 units, Disp: 3 mL, Rfl: 11   Insulin Pen Needle (PEN NEEDLES) 31G  X 5 MM MISC, Check blood sugar 4 times daily, before meals, fasting, and before bed, Disp: 100 each, Rfl: 3   norethindrone (MICRONOR) 0.35 MG tablet, Take 1 tablet (0.35 mg total) by mouth daily., Disp: 28 tablet, Rfl: 3   ondansetron (ZOFRAN) 4 MG tablet, Take 1 tablet (4 mg total) by mouth every 8 (eight) hours as needed for nausea or vomiting., Disp: 15 tablet, Rfl: 0   rizatriptan (MAXALT-MLT) 10 MG disintegrating tablet, Take 1 tablet (10 mg total) by mouth as needed for migraine. May repeat in 2 hours if needed.  Maximum 2 tablets in 24 hours., Disp: 9 tablet, Rfl: 11   Vitamin D, Ergocalciferol, (DRISDOL) 1.25 MG (50000 UNIT) CAPS capsule, Take 1 capsule (50,000 Units total) by mouth every 7 (seven) days. (Patient not taking: Reported on 11/04/2022), Disp: 12 capsule, Rfl: 0   Subjective:   PATIENT ID: Stacy George GENDER: female DOB: Aug 07, 1995, MRN: [id number]  Chief Complaint  Patient presents with   Follow-up    Echo and PFT- exposed to smoke yesterday- developed SOB.     HPI  Patient is a pleasant 27 year old female with a past medical history of type 1 diabetes who presents to clinic for follow up of shortness of breath.  She is feeling much better overall, and reports improvement in symptoms with the Air-Supra. She's only had to use it sporadically. She also felt improvement in symptoms after albuterol use during pulmonary function testing.   History obtained during our initial visit was notable for exertional dyspnea as well as shortness of breath at rest. This was exacerbated with exercise, especially going up a flight of stairs.  She denied a wheeze, had a minimal cough, and denied chest pain or chest tightness.  She at times describes a sensation where she cannot get a full breath in.  Patient is also reporting palpitations that have been persistent for the past few months.  She was seen by her primary care physician in May for shortness of breath and sent to the emergency  department where workup included blood work as well as a high-resolution CT of the chest with PE protocol (negative for clot).  Patient was given DuoNebs with some improvement and discharged home to follow-up with primary care.   She was recently diagnosed with type 1 diabetes and is currently on insulin pump.  She has a history of palpitations and has had a long-term cardiac monitor without any evidence of arrhythmias.  She reports a history of childhood asthma for which she had required nebulizers/inhalers as a child but not as a teenager or young adult.   Patient is originally from [place] where she grew up, and moved to [place] 2 years ago.  She reports some cold intolerance while she lived in [place].  She has always had dogs and currently has 2 dogs as well as 1 cat.  She denies any smoking but reports having used cigarettes and vapes in the past.  Ancillary information including prior medications, full medical/surgical/family/social histories, and PFTs (when available) are listed below and  have been reviewed.   Review of Systems  Constitutional:  Negative for chills, fever, malaise/fatigue and weight loss.  Respiratory:  Positive for shortness of breath. Negative for cough, hemoptysis, sputum production and wheezing.   Cardiovascular:  Positive for palpitations. Negative for chest pain and leg swelling.     Objective:   Vitals:   11/04/22 0841  BP: 124/70  Pulse: 89  Temp: 97.7 F (36.5 C)  TempSrc: Temporal  SpO2: 100%  Weight: 134 lb 6.4 oz (61 kg)  Height: 5\' 6"  (1.676 m)   100% on RA BMI Readings from Last 3 Encounters:  11/04/22 21.69 kg/m  10/09/22 21.94 kg/m  09/18/22 21.93 kg/m   Wt Readings from Last 3 Encounters:  11/04/22 134 lb 6.4 oz (61 kg)  10/09/22 131 lb 13.4 oz (59.8 kg)  09/18/22 131 lb 12.8 oz (59.8 kg)    Physical Exam Constitutional:      Appearance: Normal appearance. She is not ill-appearing.  HENT:     Head:  Normocephalic.     Mouth/Throat:     Mouth: Mucous membranes are moist.  Cardiovascular:     Rate and Rhythm: Normal rate and regular rhythm.     Pulses: Normal pulses.     Heart sounds: Normal heart sounds.  Pulmonary:     Effort: Pulmonary effort is normal. No respiratory distress.     Breath sounds: Normal breath sounds. No stridor. No wheezing, rhonchi or rales.  Abdominal:     Palpations: Abdomen is soft.  Musculoskeletal:     Right lower leg: No edema.     Left lower leg: No edema.  Neurological:     General: No focal deficit present.     Mental Status: She is alert and oriented to person, place, and time. Mental status is at baseline.       Ancillary Information    Past Medical History:  Diagnosis Date   Anxiety    Diabetes mellitus without complication (HCC)    DM type 1   Migraines    Periodontal disease    Seasonal allergies      Family History  Problem Relation Age of Onset   Hypertension Maternal Grandmother    Diabetes Maternal Grandfather    Diabetes Paternal Grandmother    Breast cancer Paternal Grandmother        not sure of age   Pancreatic cancer Paternal Grandmother      Past Surgical History:  Procedure Laterality Date   KNEE SURGERY     cyst and fluid removed from behind knee cap   NASAL POLYP EXCISION     periodontal surgery     WISDOM TOOTH EXTRACTION      Social History   Socioeconomic History   Marital status: Married    Spouse name: Not on file   Number of children: Not on file   Years of education: Not on file   Highest education level: Not on file  Occupational History   Occupation: Personal banker    Employer: [employer]  Tobacco Use   Smoking status: Former    Current packs/day: 0.00    Types: Cigarettes, E-cigarettes    Start date: 2018    Quit date: 2020    Years since quitting: 4.6   Smokeless tobacco: Never  Vaping Use   Vaping status: Some Days   Start date: 04/01/2019  Substance and Sexual Activity    Alcohol use: Yes    Alcohol/week: 1.0 standard drink of alcohol  Types: 1 Standard drinks or equivalent per week    Comment: 1 month   Drug use: Yes    Types: Marijuana    Comment: for anxiety - had medical card in [place]   Sexual activity: Yes    Partners: Male    Birth control/protection: None, Pill  Other Topics Concern   Not on file  Social History Narrative   Are you right handed or left handed? Right   Are you currently employed ? 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 NO   Who lives with you? Husband   What type of home do you live in: 1 story or 2 story? 1       Social Determinants of Health   Financial Resource Strain: Not on file  Food Insecurity: Not on file  Transportation Needs: Not on file  Physical Activity: Not on file  Stress: Not on file  Social Connections: Not on file  Intimate Partner Violence: Not on file     Allergies  Allergen Reactions   Other     Environmental allergies     CBC    Component Value Date/Time   WBC 10.2 10/09/2022 1243   RBC 4.48 10/09/2022 1243   HGB 13.2 10/09/2022 1243   HGB 12.3 04/10/2022 1446   HCT 39.2 10/09/2022 1243   HCT 37.2 04/10/2022 1446   PLT 361 10/09/2022 1243   PLT 354 04/10/2022 1446   MCV 87.5 10/09/2022 1243   MCV 90 04/10/2022 1446   MCH 29.5 10/09/2022 1243   MCHC 33.7 10/09/2022 1243   RDW 11.9 10/09/2022 1243   RDW 12.1 04/10/2022 1446    Pulmonary Functions Testing Results:    Latest Ref Rng & Units 10/23/2022   10:27 AM  PFT Results  FVC-Pre L 3.50   FVC-Predicted Pre % 89   FVC-Post L 3.47   FVC-Predicted Post % 88   Pre FEV1/FVC % % 73   Post FEV1/FCV % % 90   FEV1-Pre L 2.56   FEV1-Predicted Pre % 76   FEV1-Post L 3.12   DLCO uncorrected ml/min/mmHg 20.58   DLCO UNC% % 88   DLVA Predicted % 64   TLC L 4.18   TLC % Predicted % 80   RV % Predicted % 66     Outpatient Medications Prior to Visit  Medication Sig Dispense Refill    ALPRAZolam (XANAX) 0.25 MG tablet Take 1-2 tablets (0.25-0.5 mg total) by mouth 2 (two) times daily as needed for anxiety. 30 tablet 1   busPIRone (BUSPAR) 5 MG tablet Take 1 tablet (5 mg total) by mouth 2 (two) times daily. 60 tablet 3   Continuous Blood Gluc Sensor (FREESTYLE LIBRE 2 SENSOR) MISC Apply device according to manufacturers instructions for continuous blood glucose monitoring. 2 each 5   Erenumab-aooe (AIMOVIG) 140 MG/ML SOAJ Inject 140 mg into the skin every 30 (thirty) days. 1.12 mL 11   FLUoxetine (PROZAC) 20 MG capsule Take 2 capsules (40 mg total) by mouth daily. Increase to 1.5 tabs for one week. Then increase to 2 tabs. 60 capsule 3   fluticasone (FLONASE) 50 MCG/ACT nasal spray Place 2 sprays into both nostrils daily. 16 g 6   insulin detemir (LEVEMIR) 100 UNIT/ML FlexPen Inject 10 Units into the skin at bedtime. 15 mL 2   insulin lispro (HUMALOG) 200 UNIT/ML KwikPen Before meals: 121-150: 1 unit, 151-200: 2 units, 201-250: 3 units, 251-300: 5  units, 301-350: 7 units, 351-400: 9 units      Bedtime: 121-200: 0 units, 201-250: 2 units, 251-300: 3 units, 301-350: 4 units, 351-400: 5 units 3 mL 11   Insulin Pen Needle (PEN NEEDLES) 31G X 5 MM MISC Check blood sugar 4 times daily, before meals, fasting, and before bed 100 each 3   norethindrone (MICRONOR) 0.35 MG tablet Take 1 tablet (0.35 mg total) by mouth daily. 28 tablet 3   ondansetron (ZOFRAN) 4 MG tablet Take 1 tablet (4 mg total) by mouth every 8 (eight) hours as needed for nausea or vomiting. 15 tablet 0   rizatriptan (MAXALT-MLT) 10 MG disintegrating tablet Take 1 tablet (10 mg total) by mouth as needed for migraine. May repeat in 2 hours if needed.  Maximum 2 tablets in 24 hours. 9 tablet 11   Albuterol-Budesonide (AIRSUPRA) 90-80 MCG/ACT AERO Inhale 2 puffs into the lungs every 4 (four) hours as needed.     Vitamin D, Ergocalciferol, (DRISDOL) 1.25 MG (50000 UNIT) CAPS capsule Take 1 capsule (50,000 Units total) by mouth  every 7 (seven) days. (Patient not taking: Reported on 11/04/2022) 12 capsule 0   No facility-administered medications prior to visit.

## 2022-11-26 NOTE — Progress Notes (Deleted)
NEUROLOGY FOLLOW UP OFFICE NOTE  Stacy George [id number]  Assessment/Plan:   Migraine with aura, without status migrainosus, not intractable Chronic daily headache Chronic nausea Lightheadedness - She has low blood pressure but orthostatic vitals are negative.     Migraine prevention:  Aimovig 140mg  ***I cannot start beta blocker due to baseline low blood pressure and feeling lightheaded.  I do not want to start topiramate as she is already thin and do not want to suppress her appetite that may complicate her diabetes.  She is already on an antidepressant and would rather not add another one. Migraine rescue:  rizatriptan-MLT 10mg .  As she has chronic nausea, a disintegrating tablet may be more effective.  May use ibuprofen if more effective. *** Limit use of pain relievers to no more than 2 days out of week to prevent risk of rebound or medication-overuse headache. Zofran for nausea Keep headache diary Follow up ***  Subjective:  Stacy George is a 27 year old female with DM I and anxiety follows up for migraines.  UPDATE: Started Aimovig.  Rizatriptan,   Intensity:  *** Duration:  *** Frequency:  *** Frequency of abortive medication: *** Current NSAIDS/analgesics:  ibuprofen (3-4 days a week) Current triptans:  rizatriptan 10mg  *** Current ergotamine:  none Current anti-emetic:  Zofran 4mg  Current muscle relaxants:  none Current Antihypertensive medications:  none Current Antidepressant medications:  fluoxetine 40mg  daily Current Anticonvulsant medications:  none Current anti-CGRP:  Aimovig 140mg  *** Current Vitamins/Herbal/Supplements:  D Current Antihistamines/Decongestants:  Flonase Other therapy:  none Birth control:  norethindrone Other medications:  alprazolam 0.25-0.5mg  BID PRN (anxiety), Humalog, insulin pump     Caffeine:  Red Bull once in awhile.  Rarely coffee Diet:  32 oz water daily.  Gatorade.  Skips meals Exercise:  at work uses stairs and  walks frequently Depression:  no; Anxiety:  yes.  Has panic attacks.  Feels stressed Other pain:  generalized body aches Sleep hygiene:  6-7 hours sleep a night.  HISTORY:  She has history of migraines since childhood, which have been more frequent over the past year.  They are a 5-6/10 throbbing pain across the front and sometimes back of her head.  Associated with seeing spots in her vision, nausea, photophobia, phonophobia and osmophobia.  Usually lasts 1 hour and have been occurring 2-3 days a week.  No known triggers.  Laying down to rest in the dark helps.  Movement aggravates them.   For the past year, she has been experiencing a dull persistent pulsating headache on the top of her head.   She also reports persistent nausea.  She has been diagnosed with gastroenteritis.   She has been experiencing lightheadedness, a sensation that she is going to pass out.  Happens spontaneously, not just with movement, but does happen when she stands up.  Has not actually passed out.  Usually occurs more frequently in the morning.  Not associated with any significant abnormalities of her blood glucose.  CBC without anemia.  BMP without evidence of electrolyte abnormalities.     Past NSAIDS/analgesics:  Tylenol Past abortive triptans:  none Past abortive ergotamine:  none Past muscle relaxants:  none Past anti-emetic:  none Past antihypertensive medications:  would not use due to baseline hypotension Past antidepressant medications:  none Past anticonvulsant medications:  none.  Would not use topiramate as she already is thin and cannot lose more weight. Past anti-CGRP:  none Past vitamins/Herbal/Supplements:  none Past antihistamines/decongestants:  none Other past therapies:  none  Family history of headache:  mom (headaches)  PAST MEDICAL HISTORY: Past Medical History:  Diagnosis Date   Anxiety    Diabetes mellitus without complication (HCC)    DM type 1   Migraines    Periodontal  disease    Seasonal allergies     MEDICATIONS: Current Outpatient Medications on File Prior to Visit  Medication Sig Dispense Refill   Albuterol-Budesonide (AIRSUPRA) 90-80 MCG/ACT AERO Inhale 2 puffs into the lungs every 4 (four) hours as needed. 10.7 g 11   ALPRAZolam (XANAX) 0.25 MG tablet Take 1-2 tablets (0.25-0.5 mg total) by mouth 2 (two) times daily as needed for anxiety. 30 tablet 1   budesonide-formoterol (SYMBICORT) 80-4.5 MCG/ACT inhaler Inhale 2 puffs into the lungs in the morning and at bedtime. 1 each 12   busPIRone (BUSPAR) 5 MG tablet Take 1 tablet (5 mg total) by mouth 2 (two) times daily. 60 tablet 3   Continuous Blood Gluc Sensor (FREESTYLE LIBRE 2 SENSOR) MISC Apply device according to manufacturers instructions for continuous blood glucose monitoring. 2 each 5   Erenumab-aooe (AIMOVIG) 140 MG/ML SOAJ Inject 140 mg into the skin every 30 (thirty) days. 1.12 mL 11   FLUoxetine (PROZAC) 20 MG capsule Take 2 capsules (40 mg total) by mouth daily. Increase to 1.5 tabs for one week. Then increase to 2 tabs. 60 capsule 3   fluticasone (FLONASE) 50 MCG/ACT nasal spray Place 2 sprays into both nostrils daily. 16 g 6   insulin detemir (LEVEMIR) 100 UNIT/ML FlexPen Inject 10 Units into the skin at bedtime. 15 mL 2   insulin lispro (HUMALOG) 200 UNIT/ML KwikPen Before meals: 121-150: 1 unit, 151-200: 2 units, 201-250: 3 units, 251-300: 5 units, 301-350: 7 units, 351-400: 9 units      Bedtime: 121-200: 0 units, 201-250: 2 units, 251-300: 3 units, 301-350: 4 units, 351-400: 5 units 3 mL 11   Insulin Pen Needle (PEN NEEDLES) 31G X 5 MM MISC Check blood sugar 4 times daily, before meals, fasting, and before bed 100 each 3   norethindrone (MICRONOR) 0.35 MG tablet Take 1 tablet (0.35 mg total) by mouth daily. 28 tablet 3   ondansetron (ZOFRAN) 4 MG tablet Take 1 tablet (4 mg total) by mouth every 8 (eight) hours as needed for nausea or vomiting. 15 tablet 0   rizatriptan (MAXALT-MLT) 10 MG  disintegrating tablet Take 1 tablet (10 mg total) by mouth as needed for migraine. May repeat in 2 hours if needed.  Maximum 2 tablets in 24 hours. 9 tablet 11   Vitamin D, Ergocalciferol, (DRISDOL) 1.25 MG (50000 UNIT) CAPS capsule Take 1 capsule (50,000 Units total) by mouth every 7 (seven) days. (Patient not taking: Reported on 11/04/2022) 12 capsule 0   No current facility-administered medications on file prior to visit.    ALLERGIES: Allergies  Allergen Reactions   Other     Environmental allergies    FAMILY HISTORY: Family History  Problem Relation Age of Onset   Hypertension Maternal Grandmother    Diabetes Maternal Grandfather    Diabetes Paternal Grandmother    Breast cancer Paternal Grandmother        not sure of age   Pancreatic cancer Paternal Grandmother       Objective:  *** General: No acute distress.  Patient appears ***-groomed.   Head:  Normocephalic/atraumatic Eyes:  Fundi examined but not visualized Neck: supple, no paraspinal tenderness, full range of motion Heart:  Regular rate and rhythm Lungs:  Clear to auscultation bilaterally  Back: No paraspinal tenderness Neurological Exam: alert and oriented.  Speech fluent and not dysarthric, language intact.  CN II-XII intact. Bulk and tone normal, muscle strength 5/5 throughout.  Sensation to light touch intact.  Deep tendon reflexes 2+ throughout, toes downgoing.  Finger to nose testing intact.  Gait normal, Romberg negative.   Shon Millet, DO  CC: ***

## 2022-11-28 ENCOUNTER — Ambulatory Visit: Payer: 59 | Admitting: Neurology

## 2022-12-19 ENCOUNTER — Telehealth: Payer: 59 | Admitting: Physician Assistant

## 2022-12-19 DIAGNOSIS — B9689 Other specified bacterial agents as the cause of diseases classified elsewhere: Secondary | ICD-10-CM

## 2022-12-19 DIAGNOSIS — J208 Acute bronchitis due to other specified organisms: Secondary | ICD-10-CM

## 2022-12-19 MED ORDER — AZITHROMYCIN 250 MG PO TABS
ORAL_TABLET | ORAL | 0 refills | Status: AC
Start: 2022-12-19 — End: 2022-12-24

## 2022-12-19 MED ORDER — BENZONATATE 100 MG PO CAPS
100.0000 mg | ORAL_CAPSULE | Freq: Three times a day (TID) | ORAL | 0 refills | Status: AC | PRN
Start: 2022-12-19 — End: ?

## 2022-12-19 MED ORDER — ALBUTEROL SULFATE HFA 108 (90 BASE) MCG/ACT IN AERS
1.0000 | INHALATION_SPRAY | Freq: Four times a day (QID) | RESPIRATORY_TRACT | 0 refills | Status: AC | PRN
Start: 2022-12-19 — End: ?

## 2022-12-19 NOTE — Progress Notes (Signed)
E-Visit for Cough  We are sorry that you are not feeling well.  Here is how we plan to help!  Based on your presentation I believe you most likely have A cough due to bacteria.  When patients have a fever and a productive cough with a change in color or increased sputum production, we are concerned about bacterial bronchitis.  If left untreated it can progress to pneumonia.  If your symptoms do not improve with your treatment plan it is important that you contact your provider.   I have prescribed Azithromyin 250 mg: two tablets now and then one tablet daily for 4 additonal days    In addition you may use A non-prescription cough medication called Mucinex DM: take 2 tablets every 12 hours. and A prescription cough medication called Tessalon Perles 100mg . You may take 1-2 capsules every 8 hours as needed for your cough.  Albuterol inhaler Use 1-2 puffs every 6 hours as needed for shortness of breath and wheezing  From your responses in the eVisit questionnaire you describe inflammation in the upper respiratory tract which is causing a significant cough.  This is commonly called Bronchitis and has four common causes:   Allergies Viral Infections Acid Reflux Bacterial Infection Allergies, viruses and acid reflux are treated by controlling symptoms or eliminating the cause. An example might be a cough caused by taking certain blood pressure medications. You stop the cough by changing the medication. Another example might be a cough caused by acid reflux. Controlling the reflux helps control the cough.  USE OF BRONCHODILATOR ("RESCUE") INHALERS: There is a risk from using your bronchodilator too frequently.  The risk is that over-reliance on a medication which only relaxes the muscles surrounding the breathing tubes can reduce the effectiveness of medications prescribed to reduce swelling and congestion of the tubes themselves.  Although you feel brief relief from the bronchodilator inhaler, your asthma  may actually be worsening with the tubes becoming more swollen and filled with mucus.  This can delay other crucial treatments, such as oral steroid medications. If you need to use a bronchodilator inhaler daily, several times per day, you should discuss this with your provider.  There are probably better treatments that could be used to keep your asthma under control.     HOME CARE Only take medications as instructed by your medical team. Complete the entire course of an antibiotic. Drink plenty of fluids and get plenty of rest. Avoid close contacts especially the very young and the elderly Cover your mouth if you cough or cough into your sleeve. Always remember to wash your hands A steam or ultrasonic humidifier can help congestion.   GET HELP RIGHT AWAY IF: You develop worsening fever. You become short of breath You cough up blood. Your symptoms persist after you have completed your treatment plan MAKE SURE YOU  Understand these instructions. Will watch your condition. Will get help right away if you are not doing well or get worse.    Thank you for choosing an e-visit.  Your e-visit answers were reviewed by a board certified advanced clinical practitioner to complete your personal care plan. Depending upon the condition, your plan could have included both over the counter or prescription medications.  Please review your pharmacy choice. Make sure the pharmacy is open so you can pick up prescription now. If there is a problem, you may contact your provider through [employer] and have the prescription routed to another pharmacy.  Your safety is important to  [place]. If you have drug allergies check your prescription carefully.   For the next 24 hours you can use MyChart to ask questions about today's visit, request a non-urgent call back, or ask for a work or school excuse. You will get an email in the next two days asking about your experience. I hope that your e-visit has been  valuable and will speed your recovery.  I have spent 5 minutes in review of e-visit questionnaire, review and updating patient chart, medical decision making and response to patient.   Margaretann Loveless, PA-C

## 2022-12-29 ENCOUNTER — Other Ambulatory Visit: Payer: Self-pay | Admitting: Physician Assistant

## 2022-12-29 ENCOUNTER — Encounter: Payer: Self-pay | Admitting: Physician Assistant

## 2022-12-29 DIAGNOSIS — R7989 Other specified abnormal findings of blood chemistry: Secondary | ICD-10-CM

## 2022-12-29 DIAGNOSIS — E109 Type 1 diabetes mellitus without complications: Secondary | ICD-10-CM

## 2022-12-29 MED ORDER — OMNIPOD 5 DEXG7G6 PODS GEN 5 MISC
12 refills | Status: DC
Start: 2022-12-29 — End: 2023-05-14

## 2022-12-30 NOTE — Telephone Encounter (Signed)
Spoke w/ Pt- lab appt scheduled.
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
## 2023-01-02 ENCOUNTER — Telehealth (INDEPENDENT_AMBULATORY_CARE_PROVIDER_SITE_OTHER): Payer: 59 | Admitting: Physician Assistant

## 2023-01-02 ENCOUNTER — Emergency Department
Admission: EM | Admit: 2023-01-02 | Discharge: 2023-01-02 | Disposition: A | Discharge status: Home / Self Care | Payer: 59 | Attending: Emergency Medicine | Admitting: Emergency Medicine

## 2023-01-02 ENCOUNTER — Encounter: Payer: Self-pay | Admitting: Emergency Medicine

## 2023-01-02 ENCOUNTER — Other Ambulatory Visit: Payer: Self-pay

## 2023-01-02 ENCOUNTER — Emergency Department: Payer: 59

## 2023-01-02 ENCOUNTER — Encounter: Payer: Self-pay | Admitting: Physician Assistant

## 2023-01-02 VITALS — BP 122/93 | HR 87 | Temp 98.1°F | Ht 66.0 in | Wt 134.0 lb

## 2023-01-02 DIAGNOSIS — F411 Generalized anxiety disorder: Secondary | ICD-10-CM

## 2023-01-02 DIAGNOSIS — F410 Panic disorder [episodic paroxysmal anxiety] without agoraphobia: Secondary | ICD-10-CM | POA: Diagnosis not present

## 2023-01-02 DIAGNOSIS — R079 Chest pain, unspecified: Secondary | ICD-10-CM | POA: Diagnosis present

## 2023-01-02 DIAGNOSIS — E1065 Type 1 diabetes mellitus with hyperglycemia: Secondary | ICD-10-CM | POA: Insufficient documentation

## 2023-01-02 DIAGNOSIS — F419 Anxiety disorder, unspecified: Secondary | ICD-10-CM | POA: Insufficient documentation

## 2023-01-02 LAB — CBC WITH DIFFERENTIAL/PLATELET
Abs Immature Granulocytes: 0.08 10*3/uL — ABNORMAL HIGH (ref 0.00–0.07)
Basophils Absolute: 0.1 10*3/uL (ref 0.0–0.1)
Basophils Relative: 1 %
Eosinophils Absolute: 0.3 10*3/uL (ref 0.0–0.5)
Eosinophils Relative: 2 %
HCT: 38.1 % (ref 36.0–46.0)
Hemoglobin: 12.9 g/dL (ref 12.0–15.0)
Immature Granulocytes: 1 %
Lymphocytes Relative: 15 %
Lymphs Abs: 2.2 10*3/uL (ref 0.7–4.0)
MCH: 29.1 pg (ref 26.0–34.0)
MCHC: 33.9 g/dL (ref 30.0–36.0)
MCV: 86 fL (ref 80.0–100.0)
Monocytes Absolute: 0.9 10*3/uL (ref 0.1–1.0)
Monocytes Relative: 6 %
Neutro Abs: 10.8 10*3/uL — ABNORMAL HIGH (ref 1.7–7.7)
Neutrophils Relative %: 75 %
Platelets: 329 10*3/uL (ref 150–400)
RBC: 4.43 MIL/uL (ref 3.87–5.11)
RDW: 11.5 % (ref 11.5–15.5)
WBC: 14.4 10*3/uL — ABNORMAL HIGH (ref 4.0–10.5)
nRBC: 0 % (ref 0.0–0.2)

## 2023-01-02 LAB — BASIC METABOLIC PANEL
Anion gap: 11 (ref 5–15)
BUN: 13 mg/dL (ref 6–20)
CO2: 22 mmol/L (ref 22–32)
Calcium: 9 mg/dL (ref 8.9–10.3)
Chloride: 102 mmol/L (ref 98–111)
Creatinine, Ser: 0.64 mg/dL (ref 0.44–1.00)
GFR, Estimated: >60 mL/min (ref >60)
Glucose, Bld: 200 mg/dL — ABNORMAL HIGH (ref 70–99)
Potassium: 3.7 mmol/L (ref 3.5–5.1)
Sodium: 135 mmol/L (ref 135–145)

## 2023-01-02 LAB — TROPONIN I (HIGH SENSITIVITY): Troponin I (High Sensitivity): <2 ng/L (ref <18)

## 2023-01-02 LAB — POC URINE PREG, ED: Preg Test, Ur: NEGATIVE

## 2023-01-02 MED ORDER — LORAZEPAM 1 MG PO TABS
1.0000 mg | ORAL_TABLET | Freq: Once | ORAL | Status: AC
  Administered 2023-01-02: 1 mg via ORAL
  Filled 2023-01-02: qty 1

## 2023-01-02 MED ORDER — FLUOXETINE HCL 20 MG PO CAPS: 20.0000 mg | ORAL_CAPSULE | Freq: Every day | ORAL | 3 refills | Status: DC

## 2023-01-02 MED ORDER — CLONAZEPAM 0.5 MG PO TABS: 0.5000 mg | ORAL_TABLET | Freq: Three times a day (TID) | ORAL | 0 refills | Status: DC | PRN
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
## 2023-01-02 NOTE — ED Triage Notes (Signed)
Pt was driving to work when she had a panic attack, pulled over, and called EMS. Pt has hx of panic attacks and anxiety. States she's had a lot going on recently. Has dr appt today at 4p for medication and therapy management. Pt states she ran out of her PRN anxiety meds. EMS vitals: BP 160/92 HR 130 RR 22 95% RA

## 2023-01-02 NOTE — Assessment & Plan Note (Signed)
W/ panic d/o now.  Advised to go back on prozac-- start w/ 20 mg for a week and then increase to 40 mg.  Changing xanax to klonopin for better help w/ panic symptoms.  Encouraged therapy again.  F/u 4 weeks

## 2023-01-02 NOTE — Discharge Instructions (Signed)
Please follow-up with your doctor today as you have scheduled.  Please return for any new, worsening, or changing symptoms or other concerns.  It was a pleasure caring for you today.
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
## 2023-01-05 ENCOUNTER — Other Ambulatory Visit: Payer: Self-pay | Admitting: Physician Assistant

## 2023-01-05 DIAGNOSIS — E109 Type 1 diabetes mellitus without complications: Secondary | ICD-10-CM

## 2023-01-05 MED ORDER — DEXCOM G6 SENSOR MISC
12 refills | Status: DC
Start: 2023-01-05 — End: 2023-05-14

## 2023-01-05 MED ORDER — DEXCOM G6 TRANSMITTER MISC: 3 refills | Status: AC

## 2023-01-07 ENCOUNTER — Other Ambulatory Visit: Payer: 59

## 2023-01-09 ENCOUNTER — Other Ambulatory Visit: Payer: Self-pay

## 2023-01-09 ENCOUNTER — Emergency Department
Admission: EM | Admit: 2023-01-09 | Discharge: 2023-01-09 | Disposition: A | Discharge status: Home / Self Care | Payer: 59 | Attending: Emergency Medicine | Admitting: Emergency Medicine

## 2023-01-09 DIAGNOSIS — Z794 Long term (current) use of insulin: Secondary | ICD-10-CM | POA: Diagnosis not present

## 2023-01-09 DIAGNOSIS — F410 Panic disorder [episodic paroxysmal anxiety] without agoraphobia: Secondary | ICD-10-CM | POA: Diagnosis present

## 2023-01-09 DIAGNOSIS — F419 Anxiety disorder, unspecified: Secondary | ICD-10-CM

## 2023-01-09 LAB — CBC
HCT: 37 % (ref 36.0–46.0)
Hemoglobin: 12.6 g/dL (ref 12.0–15.0)
MCH: 29.4 pg (ref 26.0–34.0)
MCHC: 34.1 g/dL (ref 30.0–36.0)
MCV: 86.2 fL (ref 80.0–100.0)
Platelets: 350 10*3/uL (ref 150–400)
RBC: 4.29 MIL/uL (ref 3.87–5.11)
RDW: 11.5 % (ref 11.5–15.5)
WBC: 7 10*3/uL (ref 4.0–10.5)
nRBC: 0 % (ref 0.0–0.2)

## 2023-01-09 LAB — TROPONIN I (HIGH SENSITIVITY): Troponin I (High Sensitivity): <2 ng/L (ref <18)

## 2023-01-09 LAB — POC URINE PREG, ED: Preg Test, Ur: NEGATIVE

## 2023-01-09 LAB — BASIC METABOLIC PANEL
Anion gap: 15 (ref 5–15)
BUN: 13 mg/dL (ref 6–20)
CO2: 20 mmol/L — ABNORMAL LOW (ref 22–32)
Calcium: 9 mg/dL (ref 8.9–10.3)
Chloride: 99 mmol/L (ref 98–111)
Creatinine, Ser: 0.77 mg/dL (ref 0.44–1.00)
GFR, Estimated: >60 mL/min (ref >60)
Glucose, Bld: 208 mg/dL — ABNORMAL HIGH (ref 70–99)
Potassium: 3.6 mmol/L (ref 3.5–5.1)
Sodium: 134 mmol/L — ABNORMAL LOW (ref 135–145)

## 2023-01-09 MED ORDER — CLONAZEPAM 0.5 MG PO TABS
0.5000 mg | ORAL_TABLET | Freq: Once | ORAL | Status: AC
  Administered 2023-01-09: 0.5 mg via ORAL
  Filled 2023-01-09: qty 1

## 2023-01-09 MED ORDER — LORAZEPAM 2 MG/ML IJ SOLN
1.0000 mg | Freq: Once | INTRAMUSCULAR | Status: AC
  Administered 2023-01-09: 1 mg via INTRAMUSCULAR
  Filled 2023-01-09: qty 1

## 2023-01-09 NOTE — ED Triage Notes (Signed)
Pt to ED for having panic attack on the way home from work. Hx of panic attacks. Reports daily anxiety med, took clonazepam an hour ago.  Pt very tearful in triage, hyperventilating.  Reports chest discomfort and tingling to hands.

## 2023-01-09 NOTE — Discharge Instructions (Addendum)
Please increase Prozac to 40 mg daily as prescribed by your PCP.  Continue with clonazepam as prescribed as needed.  Please reach out to [facility]/counseling services for additional relief.  Return to the ER for any worsening symptoms or any urgent changes in your health.

## 2023-01-09 NOTE — ED Provider Notes (Signed)
[place] EMERGENCY DEPARTMENT AT [facility] Provider Note   CSN: [id number] Arrival date & time: 01/09/23  1834     History  Chief Complaint  Patient presents with   Panic Attack   Chest Pain    Stacy George is a 27 y.o. female.  With history of anxiety/panic attacks presents to the emergency department for panic attack.  Patient states she was driving home and felt like she could not breathe.  She felt very anxious.  She was hyperventilating and felt as if her hands are going numb.  She feels very anxious at this time.  She has been taking Prozac 20 mg daily and is supposed to increase to 40 mg daily tomorrow.  She is also on clonazepam 0.5 mg 3 times daily.  She has not taken her third dose today.  She denies any suicidal homicidal ideations.  She has not received counseling but is interested and in the process of getting this set up.  Her PCP has been managing anxiety at this time.  HPI     Home Medications Prior to Admission medications   Medication Sig Start Date End Date Taking? Authorizing Provider  albuterol (VENTOLIN HFA) 108 (90 Base) MCG/ACT inhaler Inhale 1-2 puffs into the lungs every 6 (six) hours as needed. 12/19/22   Margaretann Loveless, PA-C  Albuterol-Budesonide (AIRSUPRA) 90-80 MCG/ACT AERO Inhale 2 puffs into the lungs every 4 (four) hours as needed. 11/04/22   Raechel Chute, MD  benzonatate (TESSALON) 100 MG capsule Take 1-2 capsules (100-200 mg total) by mouth 3 (three) times daily as needed. Patient not taking: Reported on 01/02/2023 12/19/22   Margaretann Loveless, PA-C  budesonide-formoterol [facility]) 80-4.5 MCG/ACT inhaler Inhale 2 puffs into the lungs in the morning and at bedtime. 11/04/22   Raechel Chute, MD  busPIRone (BUSPAR) 5 MG tablet Take 1 tablet (5 mg total) by mouth 2 (two) times daily. Patient not taking: Reported on 01/02/2023 08/13/22   Drubel, Lillia Abed, PA-C  clonazePAM (KLONOPIN) 0.5 MG tablet Take 1 tablet (0.5 mg total) by mouth 3  (three) times daily as needed for anxiety. 01/02/23   [name], PA-C  Continuous Glucose Sensor (DEXCOM G6 SENSOR) MISC Change every 10 days to continuously monitor blood sugar 01/05/23   Drubel, Lillia Abed, PA-C  Continuous Glucose Transmitter (DEXCOM G6 TRANSMITTER) MISC Use with dexcom system, change every 3 months 01/05/23   Drubel, Lillia Abed, PA-C  Erenumab-aooe (AIMOVIG) 140 MG/ML SOAJ Inject 140 mg into the skin every 30 (thirty) days. 06/11/22   Drema Dallas, DO  FLUoxetine (PROZAC) 20 MG capsule Take 1 capsule (20 mg total) by mouth daily. For one week and then increase to 40 mg daily 01/02/23   Drubel, Lillia Abed, PA-C  fluticasone [facility]) 50 MCG/ACT nasal spray Place 2 sprays into both nostrils daily. 10/03/21   Viviano Simas, FNP  insulin detemir (LEVEMIR) 100 UNIT/ML FlexPen Inject 10 Units into the skin at bedtime. 08/14/21   [name], PA-C  Insulin Disposable Pump (OMNIPOD 5 G6 PODS, GEN 5,) MISC Change pods every 72 hours 12/29/22   Drubel, Lillia Abed, PA-C  insulin lispro (HUMALOG) 200 UNIT/ML KwikPen Before meals: 121-150: 1 unit, 151-200: 2 units, 201-250: 3 units, 251-300: 5 units, 301-350: 7 units, 351-400: 9 units      Bedtime: 121-200: 0 units, 201-250: 2 units, 251-300: 3 units, 301-350: 4 units, 351-400: 5 units 05/07/22   Drubel, Lillia Abed, PA-C  Insulin Pen Needle (PEN NEEDLES) 31G X 5 MM MISC Check blood sugar 4  times daily, before meals, fasting, and before bed 06/21/21   Drubel, Lillia Abed, PA-C  norethindrone (MICRONOR) 0.35 MG tablet Take 1 tablet (0.35 mg total) by mouth daily. 05/07/22   Glenetta Borg, CNM  ondansetron (ZOFRAN) 4 MG tablet Take 1 tablet (4 mg total) by mouth every 8 (eight) hours as needed for nausea or vomiting. Patient not taking: Reported on 01/02/2023 03/11/22   [name], NP  rizatriptan (MAXALT-MLT) 10 MG disintegrating tablet Take 1 tablet (10 mg total) by mouth as needed for migraine. May repeat in 2 hours if needed.  Maximum 2 tablets in 24 hours.  06/11/22   Drema Dallas, DO  Vitamin D, Ergocalciferol, (DRISDOL) 1.25 MG (50000 UNIT) CAPS capsule Take 1 capsule (50,000 Units total) by mouth every 7 (seven) days. Patient not taking: Reported on 11/04/2022 04/11/22   Glenetta Borg, CNM      Allergies    Other    Review of Systems   Review of Systems  Physical Exam Updated Vital Signs BP (!) 140/96   Pulse (!) 102   Temp 98.6 F (37 C)   Resp (!) 24   Ht 5\' 5"  (1.651 m)   Wt 60 kg   LMP 01/08/2023 (Exact Date)   SpO2 100%   BMI 22.01 kg/m  Physical Exam Constitutional:      Appearance: She is well-developed.  HENT:     Head: Normocephalic and atraumatic.     Right Ear: External ear normal.     Left Ear: External ear normal.     Nose: No congestion or rhinorrhea.     Mouth/Throat:     Mouth: Mucous membranes are moist.     Pharynx: No oropharyngeal exudate or posterior oropharyngeal erythema.  Eyes:     Conjunctiva/sclera: Conjunctivae normal.  Cardiovascular:     Rate and Rhythm: Normal rate.  Pulmonary:     Effort: Pulmonary effort is normal. No respiratory distress.  Abdominal:     General: There is no distension.     Tenderness: There is no abdominal tenderness. There is no right CVA tenderness, left CVA tenderness or guarding.  Musculoskeletal:        General: Normal range of motion.     Cervical back: Normal range of motion.  Skin:    General: Skin is warm.     Findings: No rash.  Neurological:     General: No focal deficit present.     Mental Status: She is alert. She is disoriented.  Psychiatric:        Mood and Affect: Mood is anxious.        Behavior: Behavior normal. Behavior is not agitated.        Thought Content: Thought content normal.     ED Results / Procedures / Treatments   Labs (all labs ordered are listed, but only abnormal results are displayed) Labs Reviewed  BASIC METABOLIC PANEL - Abnormal; Notable for the following components:      Result Value   Sodium 134 (*)    CO2 20  (*)    Glucose, Bld 208 (*)    All other components within normal limits  CBC  POC URINE PREG, ED  TROPONIN I (HIGH SENSITIVITY)    EKG None  Radiology No results found.  Procedures Procedures    Medications Ordered in ED Medications  clonazePAM (KLONOPIN) tablet 0.5 mg (0.5 mg Oral Given 01/09/23 2012)  LORazepam (ATIVAN) injection 1 mg (1 mg Intramuscular Given 01/09/23 2039)  ED Course/ Medical Decision Making/ A&P                                 Medical Decision Making Amount and/or Complexity of Data Reviewed Labs: ordered.  Risk Prescription drug management.   27 year old female with anxiety/panic attack.  Has been having worsening symptoms over the last week.  No suicidal homicidal ideations.  No auditory or visual hallucinations.  Patient has been taking Prozac 20 mg daily, tomorrow is supposed to increase to 40 mg daily.  Patient with typical anxiety panic attack earlier today resembling her previous panic attacks.  Workup consisting of EKG, CBC, BMP, urine pregnancy test, troponin all within normal limits.  Patient given Ativan 1 mg IM which helped with anxiety, panic attack.  Patient was able to get some rest.  Patient given information on counseling services/[facility].  She is given strict return precautions.  Husband is present and very supportive and is given information on supporting patient with anxiety.  Patient and husband both understand signs symptoms return to the ER for. Final Clinical Impression(s) / ED Diagnoses Final diagnoses:  Panic attack  Anxiety    Rx / DC Orders ED Discharge Orders     None         Ronnette Juniper 01/09/23 2241    Corena Herter, MD 01/12/23 1330

## 2023-01-10 NOTE — ED Notes (Signed)
Discharge instructions reviewed with patient. Patient questions answered and opportunity for education reviewed. Patient voices understanding of discharge instructions with no further questions. Patient ambulatory with steady gait to lobby.

## 2023-01-12 ENCOUNTER — Encounter: Payer: Self-pay | Admitting: Physician Assistant

## 2023-01-13 ENCOUNTER — Ambulatory Visit: Payer: 59 | Admitting: Physician Assistant

## 2023-01-13 VITALS — BP 110/74 | HR 64 | Temp 98.1°F | Ht 65.0 in | Wt 124.4 lb

## 2023-01-13 DIAGNOSIS — F410 Panic disorder [episodic paroxysmal anxiety] without agoraphobia: Secondary | ICD-10-CM

## 2023-01-13 DIAGNOSIS — E109 Type 1 diabetes mellitus without complications: Secondary | ICD-10-CM | POA: Diagnosis not present

## 2023-01-13 DIAGNOSIS — R7989 Other specified abnormal findings of blood chemistry: Secondary | ICD-10-CM | POA: Insufficient documentation

## 2023-01-13 MED ORDER — CLONAZEPAM 1 MG PO TABS
1.0000 mg | ORAL_TABLET | Freq: Three times a day (TID) | ORAL | 0 refills | Status: DC | PRN
Start: 2023-01-13 — End: 2023-02-12

## 2023-01-13 NOTE — Progress Notes (Signed)
Established patient visit   Patient: Stacy George   DOB: [birth year]   27 y.o. Female  MRN: [id number] Visit Date: 01/13/2023  Today's healthcare provider: [name], PA-C   Cc. Panic/anxiety  Subjective     Pt was seen 10/4 for worsening anxiety and panic attacks.  We re-started her prozac and changed her benzo to klonopin.  Pt reports worsening panic, despite klonopin TID, and needed to go to the ED 10/11 d/t physical symptoms --chest pain, shortness of breath.  Pt has been taking 1 mg of klonopin around 2-3 times a day for her panic symptoms. She has started a with a therapist through work and has a phone call today for someone more permanent.  She has a strong support system, but today is seeking FMLA for time out of work to help her mental health.  Medications: Outpatient Medications Prior to Visit  Medication Sig   albuterol (VENTOLIN HFA) 108 (90 Base) MCG/ACT inhaler Inhale 1-2 puffs into the lungs every 6 (six) hours as needed.   Albuterol-Budesonide (AIRSUPRA) 90-80 MCG/ACT AERO Inhale 2 puffs into the lungs every 4 (four) hours as needed.   budesonide-formoterol (SYMBICORT) 80-4.5 MCG/ACT inhaler Inhale 2 puffs into the lungs in the morning and at bedtime.   Continuous Glucose Sensor (DEXCOM G6 SENSOR) MISC Change every 10 days to continuously monitor blood sugar   Continuous Glucose Transmitter (DEXCOM G6 TRANSMITTER) MISC Use with dexcom system, change every 3 months   FLUoxetine (PROZAC) 20 MG capsule Take 1 capsule (20 mg total) by mouth daily. For one week and then increase to 40 mg daily   fluticasone (FLONASE) 50 MCG/ACT nasal spray Place 2 sprays into both nostrils daily.   insulin detemir (LEVEMIR) 100 UNIT/ML FlexPen Inject 10 Units into the skin at bedtime.   Insulin Disposable Pump (OMNIPOD 5 G6 PODS, GEN 5,) MISC Change pods every 72 hours   insulin lispro (HUMALOG) 200 UNIT/ML KwikPen Before meals: 121-150: 1 unit, 151-200: 2 units, 201-250: 3  units, 251-300: 5 units, 301-350: 7 units, 351-400: 9 units      Bedtime: 121-200: 0 units, 201-250: 2 units, 251-300: 3 units, 301-350: 4 units, 351-400: 5 units   Insulin Pen Needle (PEN NEEDLES) 31G X 5 MM MISC Check blood sugar 4 times daily, before meals, fasting, and before bed   [DISCONTINUED] clonazePAM (KLONOPIN) 0.5 MG tablet Take 1 tablet (0.5 mg total) by mouth 3 (three) times daily as needed for anxiety.   benzonatate (TESSALON) 100 MG capsule Take 1-2 capsules (100-200 mg total) by mouth 3 (three) times daily as needed. (Patient not taking: Reported on 01/02/2023)   busPIRone (BUSPAR) 5 MG tablet Take 1 tablet (5 mg total) by mouth 2 (two) times daily. (Patient not taking: Reported on 01/02/2023)   Erenumab-aooe (AIMOVIG) 140 MG/ML SOAJ Inject 140 mg into the skin every 30 (thirty) days.   ondansetron (ZOFRAN) 4 MG tablet Take 1 tablet (4 mg total) by mouth every 8 (eight) hours as needed for nausea or vomiting. (Patient not taking: Reported on 01/02/2023)   [DISCONTINUED] norethindrone (MICRONOR) 0.35 MG tablet Take 1 tablet (0.35 mg total) by mouth daily. (Patient not taking: Reported on 01/13/2023)   [DISCONTINUED] rizatriptan (MAXALT-MLT) 10 MG disintegrating tablet Take 1 tablet (10 mg total) by mouth as needed for migraine. May repeat in 2 hours if needed.  Maximum 2 tablets in 24 hours.   [DISCONTINUED] Vitamin D, Ergocalciferol, (DRISDOL) 1.25 MG (50000 UNIT) CAPS capsule Take 1 capsule (50,000  Units total) by mouth every 7 (seven) days. (Patient not taking: Reported on 11/04/2022)   No facility-administered medications prior to visit.    Review of Systems  Constitutional:  Negative for fatigue and fever.  Respiratory:  Negative for cough and shortness of breath.   Cardiovascular:  Negative for chest pain and leg swelling.  Gastrointestinal:  Negative for abdominal pain.  Neurological:  Negative for dizziness and headaches.  Psychiatric/Behavioral:  The patient is  nervous/anxious.        Objective    BP 110/74   Pulse 64   Temp 98.1 F (36.7 C) (Oral)   Ht 5\' 5"  (1.651 m)   Wt 124 lb 6 oz (56.4 kg)   LMP 01/08/2023 (Exact Date)   SpO2 100%   BMI 20.70 kg/m    Physical Exam Vitals reviewed.  Constitutional:      Appearance: She is not ill-appearing.  HENT:     Head: Normocephalic.  Eyes:     Conjunctiva/sclera: Conjunctivae normal.  Cardiovascular:     Rate and Rhythm: Normal rate.  Pulmonary:     Effort: Pulmonary effort is normal. No respiratory distress.  Neurological:     General: No focal deficit present.     Mental Status: She is alert and oriented to person, place, and time.  Psychiatric:        Mood and Affect: Mood normal.        Behavior: Behavior normal.      No results found for any visits on 01/13/23.  Assessment & Plan    Panic disorder Assessment & Plan: Pt now back on prozac 40 mg, but for < 2 weeks. Encouraged her to continue to take, will consider increasing dose to 60 mg if panic attacks still occurring at high frequency.  Klonopin increased to 1 mg TID, will complete formal controlled substance agreement next visit.  Long discussion on panic coping mechanisms, she is actively in therapy and looking for someone long term.  If symptoms not improved in the next 1-2 months will consider ref to psychiatry  Pt denies SI/HI   Okay with covering FMLA -- for now 10/16 -11/18 will reevaluate as needed Orders: -     clonazePAM; Take 1 tablet (1 mg total) by mouth 3 (three) times daily as needed for anxiety.  Dispense: 90 tablet; Refill: 0  Type 1 diabetes mellitus without complication (HCC) Assessment & Plan: Pt is in between endocrinologists and due for f/u labs  Orders: -     Comprehensive metabolic panel -     Hemoglobin A1c -     Microalbumin / creatinine urine ratio -     CBC with Differential/Platelet  Abnormal thyroid blood test -     TSH -     T4, free   Return if symptoms worsen or fail  to improve.       [name], PA-C  [facility] at [facility] [phone] (phone) [phone] (fax)  [facility]

## 2023-01-13 NOTE — Assessment & Plan Note (Signed)
Pt is in between endocrinologists and due for f/u labs

## 2023-01-13 NOTE — Assessment & Plan Note (Signed)
Pt now back on prozac 40 mg, but for < 2 weeks. Encouraged her to continue to take, will consider increasing dose to 60 mg if panic attacks still occurring at high frequency.  Klonopin increased to 1 mg TID, will complete formal controlled substance agreement next visit.  Long discussion on panic coping mechanisms, she is actively in therapy and looking for someone long term.  If symptoms not improved in the next 1-2 months will consider ref to psychiatry  Pt denies SI/HI

## 2023-02-11 NOTE — Progress Notes (Unsigned)
      Established patient visit   Patient: Stacy George   DOB: [birth year]   27 y.o. Female  MRN: [id number] Visit Date: 02/12/2023  Today's healthcare provider: [name], PA-C   No chief complaint on file.  Subjective     Pt presents today for a review of her FMLA paperwork and update on her anxiety   She also reports multiple joint pain  Medications: Outpatient Medications Prior to Visit  Medication Sig   albuterol (VENTOLIN HFA) 108 (90 Base) MCG/ACT inhaler Inhale 1-2 puffs into the lungs every 6 (six) hours as needed.   Albuterol-Budesonide (AIRSUPRA) 90-80 MCG/ACT AERO Inhale 2 puffs into the lungs every 4 (four) hours as needed.   benzonatate (TESSALON) 100 MG capsule Take 1-2 capsules (100-200 mg total) by mouth 3 (three) times daily as needed. (Patient not taking: Reported on 01/02/2023)   budesonide-formoterol (SYMBICORT) 80-4.5 MCG/ACT inhaler Inhale 2 puffs into the lungs in the morning and at bedtime.   busPIRone (BUSPAR) 5 MG tablet Take 1 tablet (5 mg total) by mouth 2 (two) times daily. (Patient not taking: Reported on 01/02/2023)   clonazePAM (KLONOPIN) 1 MG tablet Take 1 tablet (1 mg total) by mouth 3 (three) times daily as needed for anxiety.   Continuous Glucose Sensor (DEXCOM G6 SENSOR) MISC Change every 10 days to continuously monitor blood sugar   Continuous Glucose Transmitter (DEXCOM G6 TRANSMITTER) MISC Use with dexcom system, change every 3 months   Erenumab-aooe (AIMOVIG) 140 MG/ML SOAJ Inject 140 mg into the skin every 30 (thirty) days.   FLUoxetine (PROZAC) 20 MG capsule Take 1 capsule (20 mg total) by mouth daily. For one week and then increase to 40 mg daily   fluticasone (FLONASE) 50 MCG/ACT nasal spray Place 2 sprays into both nostrils daily.   insulin detemir (LEVEMIR) 100 UNIT/ML FlexPen Inject 10 Units into the skin at bedtime.   Insulin Disposable Pump (OMNIPOD 5 G6 PODS, GEN 5,) MISC Change pods every 72 hours   insulin lispro (HUMALOG)  200 UNIT/ML KwikPen Before meals: 121-150: 1 unit, 151-200: 2 units, 201-250: 3 units, 251-300: 5 units, 301-350: 7 units, 351-400: 9 units      Bedtime: 121-200: 0 units, 201-250: 2 units, 251-300: 3 units, 301-350: 4 units, 351-400: 5 units   Insulin Pen Needle (PEN NEEDLES) 31G X 5 MM MISC Check blood sugar 4 times daily, before meals, fasting, and before bed   ondansetron (ZOFRAN) 4 MG tablet Take 1 tablet (4 mg total) by mouth every 8 (eight) hours as needed for nausea or vomiting. (Patient not taking: Reported on 01/02/2023)   No facility-administered medications prior to visit.    Review of Systems {Insert previous labs (optional):23779} {See past labs  Heme  Chem  Endocrine  Serology  Results Review (optional):1}   Objective    There were no vitals taken for this visit. {Insert last BP/Wt (optional):23777}{See vitals history (optional):1}  Physical Exam  ***  No results found for any visits on 02/12/23.  Assessment & Plan    There are no diagnoses linked to this encounter.  ***  No follow-ups on file.       [name], PA-C  [facility] [phone] (phone) [phone] (fax)  [facility]

## 2023-02-12 ENCOUNTER — Encounter: Payer: Self-pay | Admitting: Physician Assistant

## 2023-02-12 ENCOUNTER — Ambulatory Visit: Payer: 59 | Admitting: Physician Assistant

## 2023-02-12 VITALS — BP 115/83 | HR 74 | Temp 97.6°F | Ht 65.0 in | Wt 125.2 lb

## 2023-02-12 DIAGNOSIS — F410 Panic disorder [episodic paroxysmal anxiety] without agoraphobia: Secondary | ICD-10-CM

## 2023-02-12 DIAGNOSIS — E109 Type 1 diabetes mellitus without complications: Secondary | ICD-10-CM

## 2023-02-12 DIAGNOSIS — M2560 Stiffness of unspecified joint, not elsewhere classified: Secondary | ICD-10-CM

## 2023-02-12 DIAGNOSIS — F411 Generalized anxiety disorder: Secondary | ICD-10-CM

## 2023-02-12 LAB — COMPREHENSIVE METABOLIC PANEL
ALT: 9 U/L (ref 0–35)
AST: 10 U/L (ref 0–37)
Albumin: 4.5 g/dL (ref 3.5–5.2)
Alkaline Phosphatase: 63 U/L (ref 39–117)
BUN: 10 mg/dL (ref 6–23)
CO2: 25 meq/L (ref 19–32)
Calcium: 9.2 mg/dL (ref 8.4–10.5)
Chloride: 102 meq/L (ref 96–112)
Creatinine, Ser: 0.68 mg/dL (ref 0.40–1.20)
GFR: 121.05 mL/min (ref >60.00)
Glucose, Bld: 230 mg/dL — ABNORMAL HIGH (ref 70–99)
Potassium: 4.2 meq/L (ref 3.5–5.1)
Sodium: 134 meq/L — ABNORMAL LOW (ref 135–145)
Total Bilirubin: 0.5 mg/dL (ref 0.2–1.2)
Total Protein: 7 g/dL (ref 6.0–8.3)

## 2023-02-12 LAB — MICROALBUMIN / CREATININE URINE RATIO
Creatinine,U: 142.8 mg/dL
Microalb Creat Ratio: 0.6 mg/g (ref 0.0–30.0)
Microalb, Ur: 0.9 mg/dL (ref 0.0–1.9)

## 2023-02-12 LAB — CBC WITH DIFFERENTIAL/PLATELET
Basophils Absolute: 0.1 K/uL (ref 0.0–0.1)
Basophils Relative: 0.7 % (ref 0.0–3.0)
Eosinophils Absolute: 0.3 K/uL (ref 0.0–0.7)
Eosinophils Relative: 3.9 % (ref 0.0–5.0)
HCT: 39.8 % (ref 36.0–46.0)
Hemoglobin: 13.1 g/dL (ref 12.0–15.0)
Lymphocytes Relative: 24.3 % (ref 12.0–46.0)
Lymphs Abs: 1.8 K/uL (ref 0.7–4.0)
MCHC: 32.8 g/dL (ref 30.0–36.0)
MCV: 90.7 fl (ref 78.0–100.0)
Monocytes Absolute: 0.6 K/uL (ref 0.1–1.0)
Monocytes Relative: 7.6 % (ref 3.0–12.0)
Neutro Abs: 4.7 K/uL (ref 1.4–7.7)
Neutrophils Relative %: 63.5 % (ref 43.0–77.0)
Platelets: 398 K/uL (ref 150.0–400.0)
RBC: 4.38 Mil/uL (ref 3.87–5.11)
RDW: 12.8 % (ref 11.5–15.5)
WBC: 7.4 K/uL (ref 4.0–10.5)

## 2023-02-12 LAB — C-REACTIVE PROTEIN: CRP: <1 mg/dL (ref 0.5–20.0)

## 2023-02-12 LAB — T4, FREE: Free T4: 1.09 ng/dL (ref 0.60–1.60)

## 2023-02-12 LAB — SEDIMENTATION RATE: Sed Rate: 1 mm/h (ref 0–20)

## 2023-02-12 LAB — HEMOGLOBIN A1C: Hgb A1c MFr Bld: 8.2 % — ABNORMAL HIGH (ref 4.6–6.5)

## 2023-02-12 LAB — TSH: TSH: 0.67 u[IU]/mL (ref 0.35–5.50)

## 2023-02-12 MED ORDER — FLUOXETINE HCL 40 MG PO CAPS
40.0000 mg | ORAL_CAPSULE | Freq: Every day | ORAL | 3 refills | Status: AC
Start: 2023-02-12 — End: ?

## 2023-02-12 MED ORDER — FLUOXETINE HCL 10 MG PO CAPS
10.0000 mg | ORAL_CAPSULE | Freq: Every day | ORAL | 3 refills | Status: AC
Start: 2023-02-12 — End: ?

## 2023-02-12 MED ORDER — CLONAZEPAM 1 MG PO TABS
1.0000 mg | ORAL_TABLET | Freq: Three times a day (TID) | ORAL | 0 refills | Status: DC | PRN
Start: 2023-02-12 — End: 2023-04-02

## 2023-02-12 NOTE — Assessment & Plan Note (Addendum)
Likely 2/2 to reduced activity. Will check ana, crp, esr. Encouraged stretching, return to physical activity, ibuprofen/tylenol prn

## 2023-02-12 NOTE — Assessment & Plan Note (Signed)
Well controlled. In between endocrinologists, taking care of refills as needed. Ordered A1c, uacr.

## 2023-02-12 NOTE — Assessment & Plan Note (Signed)
Congratulated on effort with therapy and controlling panic Discussed returning to work, this immediately caused an emotional reaction. She is still unable to drive. For now extending fmla through Jan 2nd. Discussed an altered schedule when she does return to work. Advised she discuss her feelings about work w/ therapy.   Advised she increase her prozac dose from 40 mg to 50 mg . Refilled klonopin for up to TID, but goal is reduced usage.

## 2023-02-14 LAB — ANTI-NUCLEAR AB-TITER (ANA TITER): ANA Titer 1: 1:40 {titer} — ABNORMAL HIGH

## 2023-02-14 LAB — ANA: Anti Nuclear Antibody (ANA): POSITIVE — AB

## 2023-02-16 ENCOUNTER — Telehealth: Payer: Self-pay | Admitting: Physician Assistant

## 2023-02-16 ENCOUNTER — Other Ambulatory Visit: Payer: Self-pay | Admitting: Physician Assistant

## 2023-02-16 DIAGNOSIS — R768 Other specified abnormal immunological findings in serum: Secondary | ICD-10-CM

## 2023-02-16 DIAGNOSIS — M2560 Stiffness of unspecified joint, not elsewhere classified: Secondary | ICD-10-CM

## 2023-02-16 NOTE — Telephone Encounter (Signed)
Patient called and would like a call back regarding some results. Please advise

## 2023-02-18 NOTE — Progress Notes (Signed)
Office Visit Note  Patient: Stacy George             Date of Birth: 01-Nov-1995           MRN: [id number]             PCP: [name], PA-C Referring: [name], PA-C Visit Date: 02/25/2023 Occupation: @GUAROCC @  Subjective:  Pain in multiple joints  History of Present Illness: Stacy George is a 27 y.o. female seen in consultation per request of her PCP for the evaluation of joint pain.  Patient was accompanied by her husband Stacy George today.  According the patient her symptoms started about 6 months ago with the left shoulder and left hip pain.  She has had history of left knee pain for many years.  She states that 816 she had left knee joint arthroscopic surgery for removal of an infrapatellar cyst.  In the last 2 months she has had increased discomfort in her both shoulders, both hips and both knee joints.  She states she has difficulty walking.  She has not noticed any joint swelling.  She also has some discomfort in her neck and lower back.  Patient states that she went back to [place] to visit her family home where she had x-rays of her left shoulder.  According the patient she was told that the x-rays were unremarkable.  She gives history of diarrhea alternating with constipation.  She states she usually have 1 loose stool a day without any blood.  She describes the stool to be watery.  There is no family history of rheumatoid arthritis lupus or Crohn's disease.  She is gravida 0.  She is not using any contraception.  There is no history of DVTs.  She gives history of dry mouth and dry eyes.  There is no history of malar rash, Raynaud's, photosensitivity or lymphadenopathy.    Activities of Daily Living:  Patient reports morning stiffness for all day. Patient Reports nocturnal pain.  Difficulty dressing/grooming: Reports Difficulty climbing stairs: Reports Difficulty getting out of chair: Reports Difficulty using hands for taps, buttons, cutlery, and/or writing:  Denies  Review of Systems  Constitutional:  Positive for fatigue.  HENT:  Positive for mouth dryness. Negative for mouth sores.   Eyes:  Positive for dryness.  Respiratory:  Positive for shortness of breath and wheezing. Negative for cough.   Cardiovascular:  Negative for palpitations.  Gastrointestinal:  Positive for constipation, diarrhea and nausea. Negative for blood in stool.  Endocrine: Negative for increased urination.  Genitourinary:  Negative for involuntary urination.  Musculoskeletal:  Positive for joint pain, gait problem, joint pain, myalgias, morning stiffness, muscle tenderness and myalgias. Negative for joint swelling and muscle weakness.  Skin:  Negative for color change, rash, hair loss and sensitivity to sunlight.  Allergic/Immunologic: Negative for susceptible to infections.  Neurological:  Positive for dizziness and headaches.  Hematological:  Negative for swollen glands.  Psychiatric/Behavioral:  Positive for sleep disturbance. Negative for depressed mood. The patient is nervous/anxious.     PMFS History:  Patient Active Problem List   Diagnosis Date Noted   Panic disorder 01/13/2023   Abnormal thyroid blood test 01/13/2023   SOB (shortness of breath) 10/23/2022   Palpitations 05/29/2022   Joint stiffness 05/07/2022   Right foot pain 09/10/2021   Genetic testing 08/06/2021   GAD (generalized anxiety disorder) 08/02/2021   Urinary frequency 06/25/2021   Family history of pancreatic cancer 06/25/2021   DKA (diabetic ketoacidosis) (HCC) 06/07/2021   DM (  diabetes mellitus), type 1 (HCC) 06/06/2021   Metabolic acidosis 06/06/2021   Migraines 03/27/2021    Past Medical History:  Diagnosis Date   Anxiety    Diabetes mellitus without complication (HCC)    DM type 1   Migraines    Periodontal disease    Seasonal allergies     Family History  Problem Relation Age of Onset   Heart Problems Mother    Anxiety disorder Mother    Bipolar disorder Father     Asthma Sister    Hypertension Maternal Grandmother    Diabetes Maternal Grandfather    Diabetes Paternal Grandmother    Breast cancer Paternal Grandmother        not sure of age   Pancreatic cancer Paternal Grandmother    Past Surgical History:  Procedure Laterality Date   KNEE SURGERY Left    cyst and fluid removed from behind knee cap   NASAL POLYP EXCISION     periodontal surgery     WISDOM TOOTH EXTRACTION     Social History   Social History Narrative   Are you right handed or left handed? Right   Are you currently employed ? 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 NO   Who lives with you? Husband   What type of home do you live in: 1 story or 2 story? 1       Immunization History  Administered Date(s) Administered   H1N1 01/24/2008   HPV Quadrivalent 10/15/2009, 04/29/2010, 01/24/2011   Hepatitis A 10/15/2009   Influenza,inj,Quad PF,6+ Mos 03/27/2021, 12/04/2021   Influenza-Unspecified 02/15/2007, 12/30/2007, 01/10/2009, 01/14/2010, 12/30/2010, 03/17/2012, 01/11/2013, 02/01/2014, 01/18/2015, 01/22/2016, 04/01/2017   Meningococcal Mcv4o 10/15/2009, 02/01/2014   Tdap 10/15/2009     Objective: Vital Signs: BP 114/80 (BP Location: Right Arm, Patient Position: Sitting, Cuff Size: Normal)   Pulse 89   Resp 15   Ht 5\' 5"  (1.651 m)   Wt 122 lb (55.3 kg)   BMI 20.30 kg/m    Physical Exam Vitals and nursing note reviewed.  Constitutional:      Appearance: She is well-developed.  HENT:     Head: Normocephalic and atraumatic.  Eyes:     Conjunctiva/sclera: Conjunctivae normal.  Cardiovascular:     Rate and Rhythm: Normal rate and regular rhythm.     Heart sounds: Normal heart sounds.  Pulmonary:     Effort: Pulmonary effort is normal.     Breath sounds: Normal breath sounds.  Abdominal:     General: Bowel sounds are normal.     Palpations: Abdomen is soft.  Musculoskeletal:     Cervical back: Normal range of  motion.  Lymphadenopathy:     Cervical: No cervical adenopathy.  Skin:    General: Skin is warm and dry.     Capillary Refill: Capillary refill takes less than 2 seconds.  Neurological:     Mental Status: She is alert and oriented to person, place, and time.  Psychiatric:        Behavior: Behavior normal.      Musculoskeletal Exam: Cervical, thoracic and lumbar spine were in good range of motion.  She had no difficulty reaching her toes.  She had painful range of motion of bilateral shoulder joints.  Elbow joints, wrist joints, MCPs PIPs and DIPs were in good range of motion with no synovitis.  She had painful range of motion of bilateral hip joints with discomfort over anterior  superior iliac spine.  She had painful range of motion of bilateral knee joints.  No warmth swelling or effusion was noted.  She is surgical scar over her left knee joint from previous surgery.  Ankle joints, MCPs PIPs were in good range of motion with no synovitis.  There was no plantar fasciitis or Achilles tendinitis.  CDAI Exam: CDAI Score: -- Patient Global: --; Provider Global: -- Swollen: --; Tender: -- Joint Exam 02/25/2023   No joint exam has been documented for this visit   There is currently no information documented on the homunculus. Go to the Rheumatology activity and complete the homunculus joint exam.  Investigation: No additional findings.  Imaging: No results found.  Recent Labs: Lab Results  Component Value Date   WBC 7.4 02/12/2023   HGB 13.1 02/12/2023   PLT 398.0 02/12/2023   NA 134 (L) 02/12/2023   K 4.2 02/12/2023   CL 102 02/12/2023   CO2 25 02/12/2023   GLUCOSE 230 (H) 02/12/2023   BUN 10 02/12/2023   CREATININE 0.68 02/12/2023   BILITOT 0.5 02/12/2023   ALKPHOS 63 02/12/2023   AST 10 02/12/2023   ALT 9 02/12/2023   PROT 7.0 02/12/2023   ALBUMIN 4.5 02/12/2023   CALCIUM 9.2 02/12/2023   February 12, 2023 ANA 1: 40 NS, ESR 1, CRP<1.0, TSH normal, hemoglobin A1c  8.2  Speciality Comments: No specialty comments available.  Procedures:  No procedures performed Allergies: Other   Assessment / Plan:     Visit Diagnoses: Chronic pain of both shoulders -patient complains of progressive pain and discomfort in the bilateral shoulder joints for the last 6 months which has been severe in the last 2 months.  She had painful range of motion of bilateral shoulder joints.  She was tearful during the exam due to discomfort.  No effusion was noted in the shoulders.  Sed rate and CRP were normal in October.  Plan: XR Shoulder Right, x-rays of the shoulder joint were unremarkable.  Rheumatoid factor, Cyclic citrul peptide antibody, IgG  Chronic pain of both knees -she complains of pain and discomfort in the bilateral knee joints.  She had painful range of motion of bilateral knee joints.  She states she has difficulty tying her shoes.  No warmth swelling or effusion was noted.  She had a cyst removed from her left knee joint at age 10 and has chronic discomfort in her left knee.  Plan: XR KNEE 3 VIEW RIGHT, XR KNEE 3 VIEW LEFT.  X-rays of bilateral knee joints were unremarkable.  Chronic pain of both hips -she complains of pain and discomfort in her bilateral hips which she describes over her anterior superior leg spine.  She had full range of motion of bilateral hip joints with discomfort.  Plan: XR HIPS BILAT W OR W/O PELVIS 3-4 VIEWS.  X-rays of bilateral hip joints and SI joints were unremarkable.  Joint stiffness-she is of significant disc stiffness in her multiple joints mostly including her cervical spine for lumbar spine, shoulders, hips and knee joints.  None of the other joints are painful.  She denies any history of joint swelling.  I did detailed discussion with the patient.  Will obtain labs today.  If her labs are unremarkable I will obtain total body bone scan to evaluate this further.  Positive ANA (antinuclear antibody) -her ANA is low titer and not  significant.  Patient complains of significant weight loss and fatigue.  I will obtain additional serology today.  Plan: Anti-DNA  antibody, double-stranded, RNP Antibody, Anti-Smith antibody, Sjogrens syndrome-A extractable nuclear antibody, Sjogrens syndrome-B extractable nuclear antibody, C3 and C4  Weight loss - 9 pounds weight loss in 5 months -patient states that she has been struggling with constipation and diarrhea.  She also has a lot of nausea and has to take Zofran.  I will refer her to gastroenterology to rule out IBD.  Plan: Ambulatory referral to Gastroenterology, QuantiFERON-TB Gold Plus, Serum protein electrophoresis with reflex  Diarrhea, unspecified type - Diarrhea alternating with constipation for several months.  No blood in stools. -Patient has chronic diarrhea alternating with constipation.  I will refer her to gastroenterology.  Plan: Ambulatory referral to Gastroenterology  Type 1 diabetes mellitus without complication (HCC)-she is on insulin.  She is trying to establish with a new endocrinologist.  SOB (shortness of breath)-related to anxiety per patient.  Other medical problems are listed as follows:  Panic disorder  GAD (generalized anxiety disorder)  Other migraine without status migrainosus, not intractable  Family history of pancreatic cancer-paternal grandmother  Orders: Orders Placed This Encounter  Procedures   XR HIPS BILAT W OR W/O PELVIS 3-4 VIEWS   XR KNEE 3 VIEW RIGHT   XR KNEE 3 VIEW LEFT   XR Shoulder Right   Rheumatoid factor   Cyclic citrul peptide antibody, IgG   Anti-DNA antibody, double-stranded   RNP Antibody   Anti-Smith antibody   Sjogrens syndrome-A extractable nuclear antibody   Sjogrens syndrome-B extractable nuclear antibody   C3 and C4   QuantiFERON-TB Gold Plus   Serum protein electrophoresis with reflex   Ambulatory referral to Gastroenterology   No orders of the defined types were placed in this encounter.    Follow-Up  Instructions: Return for Polyarthralgia.   Pollyann Savoy, MD  Note - This record has been created using Animal nutritionist.  Chart creation errors have been sought, but may not always  have been located. Such creation errors do not reflect on  the standard of medical care.

## 2023-02-25 ENCOUNTER — Ambulatory Visit: Payer: 59

## 2023-02-25 ENCOUNTER — Encounter: Payer: Self-pay | Admitting: Rheumatology

## 2023-02-25 ENCOUNTER — Ambulatory Visit: Payer: 59 | Attending: Rheumatology | Admitting: Rheumatology

## 2023-02-25 ENCOUNTER — Telehealth: Payer: Self-pay

## 2023-02-25 ENCOUNTER — Encounter: Payer: Self-pay | Admitting: Pediatrics

## 2023-02-25 VITALS — BP 114/80 | HR 89 | Resp 15 | Ht 65.0 in | Wt 122.0 lb

## 2023-02-25 DIAGNOSIS — M25562 Pain in left knee: Secondary | ICD-10-CM | POA: Diagnosis not present

## 2023-02-25 DIAGNOSIS — M25551 Pain in right hip: Secondary | ICD-10-CM

## 2023-02-25 DIAGNOSIS — G8929 Other chronic pain: Secondary | ICD-10-CM

## 2023-02-25 DIAGNOSIS — M2560 Stiffness of unspecified joint, not elsewhere classified: Secondary | ICD-10-CM

## 2023-02-25 DIAGNOSIS — R7689 Other specified abnormal immunological findings in serum: Secondary | ICD-10-CM

## 2023-02-25 DIAGNOSIS — M25511 Pain in right shoulder: Secondary | ICD-10-CM

## 2023-02-25 DIAGNOSIS — M79671 Pain in right foot: Secondary | ICD-10-CM

## 2023-02-25 DIAGNOSIS — R0602 Shortness of breath: Secondary | ICD-10-CM

## 2023-02-25 DIAGNOSIS — R768 Other specified abnormal immunological findings in serum: Secondary | ICD-10-CM

## 2023-02-25 DIAGNOSIS — Z800 Family history of malignant neoplasm of digestive organs: Secondary | ICD-10-CM

## 2023-02-25 DIAGNOSIS — R002 Palpitations: Secondary | ICD-10-CM

## 2023-02-25 DIAGNOSIS — M25561 Pain in right knee: Secondary | ICD-10-CM | POA: Diagnosis not present

## 2023-02-25 DIAGNOSIS — M25512 Pain in left shoulder: Secondary | ICD-10-CM

## 2023-02-25 DIAGNOSIS — R197 Diarrhea, unspecified: Secondary | ICD-10-CM

## 2023-02-25 DIAGNOSIS — R634 Abnormal weight loss: Secondary | ICD-10-CM

## 2023-02-25 DIAGNOSIS — E109 Type 1 diabetes mellitus without complications: Secondary | ICD-10-CM

## 2023-02-25 DIAGNOSIS — G43809 Other migraine, not intractable, without status migrainosus: Secondary | ICD-10-CM

## 2023-02-25 DIAGNOSIS — M25552 Pain in left hip: Secondary | ICD-10-CM | POA: Diagnosis not present

## 2023-02-25 DIAGNOSIS — F411 Generalized anxiety disorder: Secondary | ICD-10-CM

## 2023-02-25 DIAGNOSIS — F410 Panic disorder [episodic paroxysmal anxiety] without agoraphobia: Secondary | ICD-10-CM

## 2023-02-25 NOTE — Telephone Encounter (Signed)
Patient was seen as a new patient today, 02/25/2023. Per Dr. Corliss Skains, if labs are negative, we will order a total body bone scan. Thanks!

## 2023-03-02 LAB — PROTEIN ELECTROPHORESIS, SERUM, WITH REFLEX
Albumin ELP: 5 g/dL — ABNORMAL HIGH (ref 3.8–4.8)
Alpha 1: 0.3 g/dL (ref 0.2–0.3)
Alpha 2: 0.6 g/dL (ref 0.5–0.9)
Beta 2: 0.4 g/dL (ref 0.2–0.5)
Beta Globulin: 0.4 g/dL (ref 0.4–0.6)
Gamma Globulin: 1.2 g/dL (ref 0.8–1.7)
Total Protein: 7.9 g/dL (ref 6.1–8.1)

## 2023-03-02 LAB — ANTI-SMITH ANTIBODY: ENA SM Ab Ser-aCnc: <1 AI

## 2023-03-02 LAB — QUANTIFERON-TB GOLD PLUS
Mitogen-NIL: 2.93 [IU]/mL
NIL: 0.01 [IU]/mL
QuantiFERON-TB Gold Plus: NEGATIVE
TB1-NIL: 0 [IU]/mL
TB2-NIL: 0 [IU]/mL

## 2023-03-02 LAB — RHEUMATOID FACTOR: Rheumatoid fact SerPl-aCnc: <10 [IU]/mL (ref <14)

## 2023-03-02 LAB — SJOGRENS SYNDROME-B EXTRACTABLE NUCLEAR ANTIBODY: SSB (La) (ENA) Antibody, IgG: <1 AI

## 2023-03-02 LAB — SJOGRENS SYNDROME-A EXTRACTABLE NUCLEAR ANTIBODY: SSA (Ro) (ENA) Antibody, IgG: <1 AI

## 2023-03-02 LAB — CYCLIC CITRUL PEPTIDE ANTIBODY, IGG: Cyclic Citrullin Peptide Ab: <16 U

## 2023-03-02 LAB — C3 AND C4
C3 Complement: 155 mg/dL (ref 83–193)
C4 Complement: 32 mg/dL (ref 15–57)

## 2023-03-02 LAB — RNP ANTIBODY: Ribonucleic Protein(ENA) Antibody, IgG: <1 AI

## 2023-03-02 LAB — ANTI-DNA ANTIBODY, DOUBLE-STRANDED: ds DNA Ab: <1 [IU]/mL

## 2023-03-02 NOTE — Progress Notes (Signed)
SPEP normal, RF negative, anti-CCP negative, dsDNA negative RNP negative, Smith negative, SSA negative, SSB negative, complements normal TB Gold negative.  I will discuss results at the follow-up visit.

## 2023-03-03 NOTE — Telephone Encounter (Signed)
Total Body Bone Scan ordered.   Attempted to contact the patient to advised and left message for patient to call the office.

## 2023-03-03 NOTE — Telephone Encounter (Signed)
Yes please order total body bone scan to rule out inflammatory arthritis.

## 2023-03-03 NOTE — Telephone Encounter (Signed)
Patient advised we have placed an order for a total body bone scan. Patient provided the number to centralized scheduling so she may call to set up the appointment.

## 2023-03-03 NOTE — Telephone Encounter (Signed)
SPEP normal, RF negative, anti-CCP negative, dsDNA negative RNP negative, Smith negative, SSA negative, SSB negative, complements normal TB Gold negative.   Do we need to order total body bone scan?

## 2023-03-04 ENCOUNTER — Encounter: Payer: Self-pay | Admitting: Physician Assistant

## 2023-03-06 ENCOUNTER — Encounter: Payer: Self-pay | Admitting: Physician Assistant

## 2023-03-06 NOTE — Telephone Encounter (Signed)
 Care team updated and letter sent for eye exam notes.

## 2023-03-09 NOTE — Progress Notes (Signed)
Office Visit Note  Patient: Stacy George             Date of Birth: November 08, 1995           MRN: [id number]             PCP: [name], PA-C Referring: [name], PA-C Visit Date: 03/23/2023 Occupation: @GUAROCC @  Subjective:  Pain in multiple joints and muscles  History of Present Illness: Stacy George is a 27 y.o. female returns today after her initial consultation on February 25, 2023.  She states she continues to live in constant discomfort.  She describes pain in all of her joints and muscles.  She describes pain in her shoulders, elbows, wrist, hands, hips, knees and her ankles.  She states all of her muscles are sore.  She continues to have diarrhea and constipation.  She has an appointment coming up with the gastroenterologist in January.  She gives history of chronic insomnia.  She continues to have dry mouth and dry eye symptoms.  Patient states that she will be moving out of state in February.    Activities of Daily Living:  Patient reports morning stiffness for 1 hour.   Patient Reports nocturnal pain.  Difficulty dressing/grooming: Reports Difficulty climbing stairs: Reports Difficulty getting out of chair: Reports Difficulty using hands for taps, buttons, cutlery, and/or writing: Denies  Review of Systems  Constitutional:  Positive for fatigue.  HENT:  Positive for mouth dryness. Negative for mouth sores.   Eyes:  Positive for dryness.  Respiratory:  Negative for shortness of breath.   Cardiovascular:  Negative for chest pain.  Gastrointestinal:  Negative for blood in stool, constipation and diarrhea.  Endocrine: Negative for increased urination.  Genitourinary:  Negative for involuntary urination.  Musculoskeletal:  Positive for joint pain, gait problem, joint pain, joint swelling, myalgias, muscle weakness, morning stiffness, muscle tenderness and myalgias.  Skin:  Negative for color change, rash, hair loss and sensitivity to sunlight.   Allergic/Immunologic: Positive for susceptible to infections.  Neurological:  Positive for dizziness and headaches.  Hematological:  Negative for swollen glands.  Psychiatric/Behavioral:  Negative for depressed mood and sleep disturbance. The patient is not nervous/anxious.     PMFS History:  Patient Active Problem List   Diagnosis Date Noted   Panic disorder 01/13/2023   Abnormal thyroid blood test 01/13/2023   SOB (shortness of breath) 10/23/2022   Palpitations 05/29/2022   Joint stiffness 05/07/2022   Right foot pain 09/10/2021   Genetic testing 08/06/2021   GAD (generalized anxiety disorder) 08/02/2021   Urinary frequency 06/25/2021   Family history of pancreatic cancer 06/25/2021   DKA (diabetic ketoacidosis) (HCC) 06/07/2021   DM (diabetes mellitus), type 1 (HCC) 06/06/2021   Metabolic acidosis 06/06/2021   Migraines 03/27/2021    Past Medical History:  Diagnosis Date   Anxiety    Diabetes mellitus without complication (HCC)    DM type 1   Migraines    Periodontal disease    Seasonal allergies     Family History  Problem Relation Age of Onset   Heart Problems Mother    Anxiety disorder Mother    Bipolar disorder Father    Asthma Sister    Hypertension Maternal Grandmother    Diabetes Maternal Grandfather    Diabetes Paternal Grandmother    Breast cancer Paternal Grandmother        not sure of age   Pancreatic cancer Paternal Grandmother    Past Surgical History:  Procedure Laterality Date  KNEE SURGERY Left    cyst and fluid removed from behind knee cap   NASAL POLYP EXCISION     periodontal surgery     WISDOM TOOTH EXTRACTION     Social History   Social History Narrative   Are you right handed or left handed? Right   Are you currently employed ? 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 NO   Who lives with you? Husband   What type of home do you live in: 1 story or 2 story? 1       Immunization  History  Administered Date(s) Administered   H1N1 01/24/2008   HPV Quadrivalent 10/15/2009, 04/29/2010, 01/24/2011   Hepatitis A 10/15/2009   Influenza,inj,Quad PF,6+ Mos 03/27/2021, 12/04/2021   Influenza-Unspecified 02/15/2007, 12/30/2007, 01/10/2009, 01/14/2010, 12/30/2010, 03/17/2012, 01/11/2013, 02/01/2014, 01/18/2015, 01/22/2016, 04/01/2017   Meningococcal Mcv4o 10/15/2009, 02/01/2014   Tdap 10/15/2009     Objective: Vital Signs: BP 115/78 (BP Location: Left Arm, Patient Position: Sitting, Cuff Size: Normal)   Pulse 80   Ht 5\' 5"  (1.651 m)   Wt 121 lb (54.9 kg)   BMI 20.14 kg/m    Physical Exam Vitals and nursing note reviewed.  Constitutional:      Appearance: She is well-developed.  HENT:     Head: Normocephalic and atraumatic.  Eyes:     Conjunctiva/sclera: Conjunctivae normal.  Cardiovascular:     Rate and Rhythm: Normal rate and regular rhythm.     Heart sounds: Normal heart sounds.  Pulmonary:     Effort: Pulmonary effort is normal.     Breath sounds: Normal breath sounds.  Abdominal:     General: Bowel sounds are normal.     Palpations: Abdomen is soft.  Musculoskeletal:     Cervical back: Normal range of motion.  Lymphadenopathy:     Cervical: No cervical adenopathy.  Skin:    General: Skin is warm and dry.     Capillary Refill: Capillary refill takes less than 2 seconds.  Neurological:     Mental Status: She is alert and oriented to person, place, and time.  Psychiatric:        Behavior: Behavior normal.      Musculoskeletal Exam: She had good range of motion of the cervical, thoracic and lumbar spine.  She had painful range of motion of bilateral shoulders.  Elbows, wrists, MCPs PIPs and DIPs with good range of motion.  No synovitis was noted.  She had painful range of motion of bilateral hips and knee joints.  No warmth swelling or effusion was noted.  There was no tenderness over ankles or MTPs.  Surgical scar was noted over the left knee.   Generalized hyperalgesia and positive tender points noted.  CDAI Exam: CDAI Score: -- Patient Global: --; Provider Global: -- Swollen: --; Tender: -- Joint Exam 03/23/2023   No joint exam has been documented for this visit   There is currently no information documented on the homunculus. Go to the Rheumatology activity and complete the homunculus joint exam.  Investigation: No additional findings.  Imaging: NM Bone Scan Whole Body Result Date: 03/23/2023 CLINICAL DATA:  Rule out inflammatory arthritis. Chronic pain normal shoulders knees and hips. Recent dental work. EXAM: NUCLEAR MEDICINE WHOLE BODY BONE SCAN TECHNIQUE: Whole body anterior and posterior images were obtained approximately 3 hours after intravenous injection of radiopharmaceutical. RADIOPHARMACEUTICALS:  21.8 mCi Technetium-35m MDP IV COMPARISON:  Plain film knees is in  hips. FINDINGS: No abnormal radiotracer activity within the knee joints, hip joints or shoulder joints. No abnormal activity in the sacroiliac joints or spine. Uptake in the LEFT mandible consistent with odontogenic activity/dental procedure. IMPRESSION: No evidence of arthropathy within the axillary or appendicular skeleton. Electronically Signed   By: Genevive Bi M.D.   On: 03/23/2023 10:50   XR HIPS BILAT W OR W/O PELVIS 3-4 VIEWS Result Date: 02/25/2023 No SI joint or hip joint narrowing was noted.  No chondrocalcinosis was noted. Impression: X-rays of bilateral hip joints were unremarkable.  XR KNEE 3 VIEW LEFT Result Date: 02/25/2023 No medial or lateral compartment narrowing was noted.  No patellofemoral narrowing was noted.  No chondrocalcinosis was noted. Impression: Unremarkable x-rays of the knee.  XR KNEE 3 VIEW RIGHT Result Date: 02/25/2023 No medial or lateral compartment narrowing was noted.  No patellofemoral narrowing was noted.  No chondrocalcinosis was noted. Impression: Unremarkable x-rays of the knee.  XR Shoulder Right Result  Date: 02/25/2023 No glenohumeral or acromioclavicular joint space narrowing was noted.  No chondrocalcinosis was noted. Impression: Unremarkable x-rays of the shoulder.   Recent Labs: Lab Results  Component Value Date   WBC 7.4 02/12/2023   HGB 13.1 02/12/2023   PLT 398.0 02/12/2023   NA 134 (L) 02/12/2023   K 4.2 02/12/2023   CL 102 02/12/2023   CO2 25 02/12/2023   GLUCOSE 230 (H) 02/12/2023   BUN 10 02/12/2023   CREATININE 0.68 02/12/2023   BILITOT 0.5 02/12/2023   ALKPHOS 63 02/12/2023   AST 10 02/12/2023   ALT 9 02/12/2023   PROT 7.9 02/25/2023   ALBUMIN 4.5 02/12/2023   CALCIUM 9.2 02/12/2023   QFTBGOLDPLUS NEGATIVE 02/25/2023   February 25, 2023 SPEP normal, TB Gold negative, C3-C4 normal, dsDNA negative, RNP negative, Smith negative, SSA negative, SSB negative, RF negative, anti-CCP negative  Speciality Comments: No specialty comments available.  Procedures:  No procedures performed Allergies: Other   Assessment / Plan:     Visit Diagnoses: Joint stiffness - History of joint stiffness in multiple joints including cervical spine, lumbar spine, shoulders, hips and knees. February 25, 2023 SPEP normal, TB Gold negative, C3-C4 normal, dsDNA negative, RNP negative, Smith negative, SSA negative, SSB negative, RF negative, anti-CCP negative.  Lab results were discussed with the patient and her husband at length.  All the labs were unremarkable.  Patient also had a total body bone scan on March 13, 2023.  IMPRESSION: No evidence of arthropathy within the axillary or appendicular skeleton.Electronically Signed By: Genevive Bi M.D. On: 03/23/2023 10:50.  Patient was notified of the results.  Chronic pain of both shoulders - Pain in bilateral shoulders for the last 6 months progressively getting worse.  X-rays obtained at the last visit were unremarkable.  X-ray findings were reviewed with the patient.  She had bilateral trapezius spasm and hyperalgesia.  Chronic pain of  both hips - Pain over anterior superior iliac spine.  Hip joints were in good range of motion.  X-rays obtained at the last visit were unremarkable.  X-ray findings were reviewed with the patient.  Chronic pain of both knees -she complains of discomfort in the bilateral knee joints.  No warmth swelling or effusion was noted.  X-rays obtained at the last visit were unremarkable.  X-ray findings were reviewed with the patient.  Positive ANA (antinuclear antibody) - ANA is low titer positive which is not significant.  ENA negative, complements normal.  Weight loss - Weight loss of 9  pounds over 5 months.  History of constipation no diarrhea.  She takes Zofran for nausea.  Patient was referred to GI.  GI appointment is pending.  Myalgia -she complains of pain in all of her muscles.  No muscular weakness or tenderness was noted.  She possibly may have fibromyalgia syndrome.  Detailed counsel regarding fibromyalgia syndrome was provided.  She generalized hyperalgesia and positive tender points.  Benefits of daily exercise, water aerobics, swimming and stretching were discussed.  Patient is moving out of state in couple of months.  She may start physical therapy there.  Plan: CK  Vitamin D deficiency -patient states she was diagnosed with vitamin D deficiency in the past and was given prescription vitamin D.  She is not taking vitamin D supplement currently.  Plan: VITAMIN D 25 Hydroxy (Vit-D Deficiency, Fractures)  Diarrhea, unspecified type - Diarrhea alternating with constipation.  Patient was referred to GI, appointment is pending.  Type 1 diabetes mellitus without complication (HCC) - She is on insulin.  Anxiety and depression-she is on BuSpar, Prozac and Klonopin.  SOB (shortness of breath) - Related to anxiety per patient.  Panic disorder  Other migraine without status migrainosus, not intractable  Family history of pancreatic cancer-paternal grandmother  Orders: Orders Placed This  Encounter  Procedures   CK   VITAMIN D 25 Hydroxy (Vit-D Deficiency, Fractures)   No orders of the defined types were placed in this encounter.    Follow-Up Instructions: Return if symptoms worsen or fail to improve, for Joint and muscle pain.   Pollyann Savoy, MD  Note - This record has been created using Animal nutritionist.  Chart creation errors have been sought, but may not always  have been located. Such creation errors do not reflect on  the standard of medical care.

## 2023-03-11 ENCOUNTER — Other Ambulatory Visit: Payer: Self-pay | Admitting: *Deleted

## 2023-03-11 DIAGNOSIS — Z3200 Encounter for pregnancy test, result unknown: Secondary | ICD-10-CM

## 2023-03-13 ENCOUNTER — Encounter
Admission: RE | Admit: 2023-03-13 | Discharge: 2023-03-13 | Disposition: A | Discharge status: Home / Self Care | Payer: 59 | Source: Ambulatory Visit | Attending: Rheumatology | Admitting: Rheumatology

## 2023-03-13 ENCOUNTER — Other Ambulatory Visit
Admission: RE | Admit: 2023-03-13 | Discharge: 2023-03-13 | Disposition: A | Discharge status: Home / Self Care | Payer: 59 | Attending: Rheumatology | Admitting: Rheumatology

## 2023-03-13 DIAGNOSIS — M25511 Pain in right shoulder: Secondary | ICD-10-CM | POA: Diagnosis present

## 2023-03-13 DIAGNOSIS — M2560 Stiffness of unspecified joint, not elsewhere classified: Secondary | ICD-10-CM | POA: Insufficient documentation

## 2023-03-13 DIAGNOSIS — R768 Other specified abnormal immunological findings in serum: Secondary | ICD-10-CM | POA: Insufficient documentation

## 2023-03-13 DIAGNOSIS — M25552 Pain in left hip: Secondary | ICD-10-CM | POA: Insufficient documentation

## 2023-03-13 DIAGNOSIS — M25562 Pain in left knee: Secondary | ICD-10-CM | POA: Diagnosis present

## 2023-03-13 DIAGNOSIS — G8929 Other chronic pain: Secondary | ICD-10-CM | POA: Insufficient documentation

## 2023-03-13 DIAGNOSIS — M25512 Pain in left shoulder: Secondary | ICD-10-CM | POA: Insufficient documentation

## 2023-03-13 DIAGNOSIS — Z3200 Encounter for pregnancy test, result unknown: Secondary | ICD-10-CM | POA: Insufficient documentation

## 2023-03-13 DIAGNOSIS — M25561 Pain in right knee: Secondary | ICD-10-CM | POA: Insufficient documentation

## 2023-03-13 DIAGNOSIS — M25551 Pain in right hip: Secondary | ICD-10-CM | POA: Insufficient documentation

## 2023-03-13 LAB — PREGNANCY, URINE: Preg Test, Ur: NEGATIVE

## 2023-03-13 MED ORDER — TECHNETIUM TC 99M MEDRONATE IV KIT
20.0000 | PACK | Freq: Once | INTRAVENOUS | Status: AC | PRN
  Administered 2023-03-13: 21.79 via INTRAVENOUS

## 2023-03-13 NOTE — Progress Notes (Signed)
Pregnancy test negative

## 2023-03-17 NOTE — Telephone Encounter (Signed)
Called patient to get the dates they were needing. Will send over 02/12/23 office notes to her case worker today.   Case worker: Jocelynn K. Fax: [phone]

## 2023-03-17 NOTE — Telephone Encounter (Signed)
Pt called to advise that [employer] received authorization notes but they need office visit notes to complete the leave of absence. Please call pt to confirm notes have been faxed.

## 2023-03-23 ENCOUNTER — Ambulatory Visit: Payer: 59 | Attending: Rheumatology | Admitting: Rheumatology

## 2023-03-23 ENCOUNTER — Encounter: Payer: Self-pay | Admitting: Rheumatology

## 2023-03-23 VITALS — BP 115/78 | HR 80 | Ht 65.0 in | Wt 121.0 lb

## 2023-03-23 DIAGNOSIS — F32A Depression, unspecified: Secondary | ICD-10-CM

## 2023-03-23 DIAGNOSIS — M25511 Pain in right shoulder: Secondary | ICD-10-CM | POA: Diagnosis not present

## 2023-03-23 DIAGNOSIS — G8929 Other chronic pain: Secondary | ICD-10-CM

## 2023-03-23 DIAGNOSIS — M25561 Pain in right knee: Secondary | ICD-10-CM

## 2023-03-23 DIAGNOSIS — M791 Myalgia, unspecified site: Secondary | ICD-10-CM

## 2023-03-23 DIAGNOSIS — M25512 Pain in left shoulder: Secondary | ICD-10-CM

## 2023-03-23 DIAGNOSIS — R634 Abnormal weight loss: Secondary | ICD-10-CM

## 2023-03-23 DIAGNOSIS — F410 Panic disorder [episodic paroxysmal anxiety] without agoraphobia: Secondary | ICD-10-CM

## 2023-03-23 DIAGNOSIS — R197 Diarrhea, unspecified: Secondary | ICD-10-CM

## 2023-03-23 DIAGNOSIS — R768 Other specified abnormal immunological findings in serum: Secondary | ICD-10-CM

## 2023-03-23 DIAGNOSIS — M25552 Pain in left hip: Secondary | ICD-10-CM

## 2023-03-23 DIAGNOSIS — M25551 Pain in right hip: Secondary | ICD-10-CM | POA: Diagnosis not present

## 2023-03-23 DIAGNOSIS — M25562 Pain in left knee: Secondary | ICD-10-CM

## 2023-03-23 DIAGNOSIS — R7689 Other specified abnormal immunological findings in serum: Secondary | ICD-10-CM

## 2023-03-23 DIAGNOSIS — F419 Anxiety disorder, unspecified: Secondary | ICD-10-CM

## 2023-03-23 DIAGNOSIS — M2560 Stiffness of unspecified joint, not elsewhere classified: Secondary | ICD-10-CM | POA: Diagnosis not present

## 2023-03-23 DIAGNOSIS — E559 Vitamin D deficiency, unspecified: Secondary | ICD-10-CM

## 2023-03-23 DIAGNOSIS — Z800 Family history of malignant neoplasm of digestive organs: Secondary | ICD-10-CM

## 2023-03-23 DIAGNOSIS — R0602 Shortness of breath: Secondary | ICD-10-CM

## 2023-03-23 DIAGNOSIS — G43809 Other migraine, not intractable, without status migrainosus: Secondary | ICD-10-CM

## 2023-03-23 DIAGNOSIS — E109 Type 1 diabetes mellitus without complications: Secondary | ICD-10-CM

## 2023-03-23 NOTE — Patient Instructions (Signed)
Myofascial Pain Syndrome and Fibromyalgia Myofascial pain syndrome and fibromyalgia are both pain disorders. You may feel this pain mainly in your muscles. Myofascial pain syndrome: Always has tender points in the muscles that will cause pain when pressed (trigger points). The pain may come and go. Usually affects your neck, upper back, and shoulder areas. The pain often moves into your arms and hands. Fibromyalgia: Has muscle pains and tenderness that come and go. Is often associated with tiredness (fatigue) and sleep problems. Has trigger points. Tends to be long-lasting (chronic), but is not life-threatening. Fibromyalgia and myofascial pain syndrome are not the same. However, they often occur together. If you have both conditions, each can make the other worse. Both are common and can cause enough pain and fatigue to make day-to-day activities difficult. Both can be hard to diagnose because their symptoms are common in many other conditions. What are the causes? The exact causes of these conditions are not known. What increases the risk? You are more likely to develop either of these conditions if: You have a family history of the condition. You are female. You have certain triggers, such as: Spine disorders. An injury (trauma) or other physical stressors. Being under a lot of stress. Medical conditions such as osteoarthritis, rheumatoid arthritis, or lupus. What are the signs or symptoms? Fibromyalgia The main symptom of fibromyalgia is widespread pain and tenderness in your muscles. Pain is sometimes described as stabbing, shooting, or burning. You may also have: Tingling or numbness. Sleep problems and fatigue. Problems with attention and concentration (fibro fog). Other symptoms may include: Bowel and bladder problems. Headaches. Vision problems. Sensitivity to odors and noises. Depression or mood changes. Painful menstrual periods (dysmenorrhea). Dry skin or eyes. These  symptoms can vary over time. Myofascial pain syndrome Symptoms of myofascial pain syndrome include: Tight, ropy bands of muscle. Uncomfortable sensations in muscle areas. These may include aching, cramping, burning, numbness, tingling, and weakness. Difficulty moving certain parts of the body freely (poor range of motion). How is this diagnosed? This condition may be diagnosed by your symptoms and medical history. You will also have a physical exam. In general: Fibromyalgia is diagnosed if you have pain, fatigue, and other symptoms for more than 3 months, and symptoms cannot be explained by another condition. Myofascial pain syndrome is diagnosed if you have trigger points in your muscles, and those trigger points are tender and cause pain elsewhere in your body (referred pain). How is this treated? Treatment for these conditions depends on the type that you have. For fibromyalgia, a healthy lifestyle is the most important treatment including aerobic and strength exercises. Different types of medicines are used to help treat pain and include: NSAIDs. Medicines for treating depression. Medicines that help control seizures. Medicines that relax the muscles. Treatment for myofascial pain syndrome includes: Pain medicines, such as NSAIDs. Cooling and stretching of muscles. Massage therapy with myofascial release technique. Trigger point injections. Treating these conditions often requires a team of health care providers. These may include: Your primary care provider. A physical therapist. Complementary health care providers, such as massage therapists or acupuncturists. A psychiatrist for cognitive behavioral therapy. Follow these instructions at home: Medicines Take over-the-counter and prescription medicines only as told by your health care provider. Ask your health care provider if the medicine prescribed to you: Requires you to avoid driving or using machinery. Can cause constipation.  You may need to take these actions to prevent or treat constipation: Drink enough fluid to keep your urine pale   yellow. Take over-the-counter or prescription medicines. Eat foods that are high in fiber, such as beans, whole grains, and fresh fruits and vegetables. Limit foods that are high in fat and processed sugars, such as fried or sweet foods. Lifestyle  Do exercises as told by your health care provider or physical therapist. Practice relaxation techniques to control your stress. You may want to try: Biofeedback. Visual imagery. Hypnosis. Muscle relaxation. Yoga. Meditation. Maintain a healthy lifestyle. This includes eating a healthy diet and getting enough sleep. Do not use any products that contain nicotine or tobacco. These products include cigarettes, chewing tobacco, and vaping devices, such as e-cigarettes. If you need help quitting, ask your health care provider. General instructions Talk to your health care provider about complementary treatments, such as acupuncture or massage. Do not do activities that stress or strain your muscles. This includes repetitive motions and heavy lifting. Keep all follow-up visits. This is important. Where to find support Consider joining a support group with others who are diagnosed with this condition. National Fibromyalgia Association: fmaware.org Where to find more information [place]. Pain Foundation: uspainfoundation.org Contact a health care provider if: You have new symptoms. Your symptoms get worse or your pain is severe. You have side effects from your medicines. You have trouble sleeping. Your condition is causing depression or anxiety. Get help right away if: You have thoughts of hurting yourself or others. Get help right away if you feel like you may hurt yourself or others, or have thoughts about taking your own life. Go to your nearest emergency room or: Call 911. Call the National Suicide Prevention Lifeline at 1-800-273-8255  or 988. This is open 24 hours a day. Text the Crisis Text Line at 741741. This information is not intended to replace advice given to you by your health care provider. Make sure you discuss any questions you have with your health care provider. Document Revised: 12/23/2021 Document Reviewed: 02/15/2021 Elsevier Patient Education  2024 [employer]vier Inc.  

## 2023-03-24 LAB — VITAMIN D 25 HYDROXY (VIT D DEFICIENCY, FRACTURES): Vit D, 25-Hydroxy: 17 ng/mL — ABNORMAL LOW (ref 30–100)

## 2023-03-24 LAB — CK: Total CK: 36 U/L (ref 29–143)

## 2023-03-25 NOTE — Progress Notes (Signed)
CK normal, vitamin D is low.  Please send a prescription for vitamin D 50,000 units twice a week for 3 months.  Repeat vitamin D in 3 months.  Patient should stay on vitamin D 2000 units daily after finishing the course of vitamin D.

## 2023-03-26 ENCOUNTER — Other Ambulatory Visit: Payer: Self-pay | Admitting: *Deleted

## 2023-03-26 DIAGNOSIS — E559 Vitamin D deficiency, unspecified: Secondary | ICD-10-CM

## 2023-03-26 MED ORDER — VITAMIN D (ERGOCALCIFEROL) 1.25 MG (50000 UNIT) PO CAPS: 50000.0000 [IU] | ORAL_CAPSULE | ORAL | 0 refills | Status: AC

## 2023-03-26 NOTE — Telephone Encounter (Signed)
-----   Message from [facility] sent at 03/25/2023  6:59 PM EST ----- CK normal, vitamin D is low.  Please send a prescription for vitamin D 50,000 units twice a week for 3 months.  Repeat vitamin D in 3 months.  Patient should stay on vitamin D 2000 units daily after finishing the course of vitamin D.

## 2023-04-02 ENCOUNTER — Telehealth (INDEPENDENT_AMBULATORY_CARE_PROVIDER_SITE_OTHER): Payer: 59 | Admitting: Physician Assistant

## 2023-04-02 ENCOUNTER — Ambulatory Visit: Payer: 59 | Admitting: Physician Assistant

## 2023-04-02 DIAGNOSIS — F410 Panic disorder [episodic paroxysmal anxiety] without agoraphobia: Secondary | ICD-10-CM

## 2023-04-02 DIAGNOSIS — E109 Type 1 diabetes mellitus without complications: Secondary | ICD-10-CM | POA: Diagnosis not present

## 2023-04-02 MED ORDER — CLONAZEPAM 1 MG PO TABS: 1.0000 mg | ORAL_TABLET | Freq: Three times a day (TID) | ORAL | 0 refills | Status: DC | PRN

## 2023-04-02 NOTE — Progress Notes (Signed)
 MyChart Video Visit    Virtual Visit via Video Note   This format is felt to be most appropriate for this patient at this time. Physical exam was limited by quality of the video and audio technology used for the visit.   Patient location: home Provider location: [email or web address]  I discussed the limitations of evaluation and management by telemedicine and the availability of in person appointments. The patient expressed understanding and agreed to proceed.  Patient: Stacy George   DOB: [birth year]   28 y.o. Female  MRN: [id number] Visit Date: 04/02/2023  Today's healthcare provider: Manuelita Flatness, PA-C   Chief Complaint  Patient presents with   FMLA forms   Subjective    HPI   Pt reports she is still struggling with driving, but is ready to return to work next week. She is wondering if we can build in some extra days to her FMLA for her type 1 DM for anxiety .  She is also moving in the next 2 months to [place] , where her family is.   Medications: Outpatient Medications Prior to Visit  Medication Sig   albuterol  (VENTOLIN  HFA) 108 (90 Base) MCG/ACT inhaler Inhale 1-2 puffs into the lungs every 6 (six) hours as needed.   Albuterol -Budesonide  (AIRSUPRA ) 90-80 MCG/ACT AERO Inhale 2 puffs into the lungs every 4 (four) hours as needed.   benzonatate  (TESSALON ) 100 MG capsule Take 1-2 capsules (100-200 mg total) by mouth 3 (three) times daily as needed.   budesonide -formoterol  (SYMBICORT ) 80-4.5 MCG/ACT inhaler Inhale 2 puffs into the lungs in the morning and at bedtime.   busPIRone  (BUSPAR ) 5 MG tablet Take 1 tablet (5 mg total) by mouth 2 (two) times daily.   Continuous Glucose Sensor (DEXCOM G6 SENSOR) MISC Change every 10 days to continuously monitor blood sugar   Continuous Glucose Transmitter (DEXCOM G6 TRANSMITTER) MISC Use with dexcom system, change every 3 months   FLUoxetine  (PROZAC ) 10 MG capsule Take 1 capsule (10 mg total) by mouth daily.   FLUoxetine  (PROZAC ) 40 MG  capsule Take 1 capsule (40 mg total) by mouth daily.   fluticasone  (FLONASE ) 50 MCG/ACT nasal spray Place 2 sprays into both nostrils daily.   insulin  detemir (LEVEMIR ) 100 UNIT/ML FlexPen Inject 10 Units into the skin at bedtime.   Insulin  Disposable Pump (OMNIPOD 5 G6 PODS, GEN 5,) MISC Change pods every 72 hours   insulin  lispro (HUMALOG ) 200 UNIT/ML KwikPen Before meals: 121-150: 1 unit, 151-200: 2 units, 201-250: 3 units, 251-300: 5 units, 301-350: 7 units, 351-400: 9 units      Bedtime: 121-200: 0 units, 201-250: 2 units, 251-300: 3 units, 301-350: 4 units, 351-400: 5 units   Insulin  Pen Needle (PEN NEEDLES) 31G X 5 MM MISC Check blood sugar 4 times daily, before meals, fasting, and before bed   ondansetron  (ZOFRAN ) 4 MG tablet Take 1 tablet (4 mg total) by mouth every 8 (eight) hours as needed for nausea or vomiting.   Vitamin D , Ergocalciferol , (DRISDOL ) 1.25 MG (50000 UNIT) CAPS capsule Take 1 capsule (50,000 Units total) by mouth 2 (two) times a week.   [DISCONTINUED] clonazePAM  (KLONOPIN ) 1 MG tablet Take 1 tablet (1 mg total) by mouth 3 (three) times daily as needed for anxiety.   [DISCONTINUED] Erenumab -aooe (AIMOVIG ) 140 MG/ML SOAJ Inject 140 mg into the skin every 30 (thirty) days. (Patient not taking: Reported on 02/25/2023)   No facility-administered medications prior to visit.    Review of Systems  Constitutional:  Negative for fatigue  and fever.  Respiratory:  Negative for cough and shortness of breath.   Cardiovascular:  Negative for chest pain and leg swelling.  Gastrointestinal:  Negative for abdominal pain.  Neurological:  Negative for dizziness and headaches.  Psychiatric/Behavioral:  The patient is nervous/anxious.         Objective    There were no vitals taken for this visit.      Physical Exam Constitutional:      Appearance: Normal appearance. She is not ill-appearing.  Neurological:     Mental Status: She is oriented to person, place, and time.   Psychiatric:        Mood and Affect: Mood normal.        Behavior: Behavior normal.        Assessment & Plan     Problem List Items Addressed This Visit       Endocrine   DM (diabetes mellitus), type 1 (HCC) - Primary (Chronic)   Will refill insulin  as needed Referring to an endo in [place]      Relevant Orders   Ambulatory referral to Endocrinology     Other   Panic disorder   Still working in therapy Continues with prozac  50 mg and klonopin  1-2 times daily. Discussed need for exposure therapy w/ driving  Sad we will no longer have a working relationship -- but happy for her to move and be closer to family!  Will provide care/refills needed through the next few months.      Relevant Medications   clonazePAM  (KLONOPIN ) 1 MG tablet    Once I receive additional FMLA paperwork, we discussing increasing days from 5 to 8 days a month to cover DM I events and anxiety/panic.  Return if symptoms worsen or fail to improve.     I discussed the assessment and treatment plan with the patient. The patient was provided an opportunity to ask questions and all were answered. The patient agreed with the plan and demonstrated an understanding of the instructions.   The patient was advised to call back or seek an in-person evaluation if the symptoms worsen or if the condition fails to improve as anticipated.  I provided 20 minutes of non-face-to-face time during this encounter.  Manuelita Flatness, PA-C [facility] at [facility] [phone] (phone) [phone] (fax)  [facility]

## 2023-04-03 ENCOUNTER — Emergency Department
Admission: EM | Admit: 2023-04-03 | Discharge: 2023-04-03 | Disposition: A | Discharge status: Home / Self Care | Payer: 59 | Attending: Emergency Medicine | Admitting: Emergency Medicine

## 2023-04-03 ENCOUNTER — Encounter: Payer: Self-pay | Admitting: Physician Assistant

## 2023-04-03 ENCOUNTER — Emergency Department: Payer: 59

## 2023-04-03 ENCOUNTER — Other Ambulatory Visit: Payer: Self-pay

## 2023-04-03 DIAGNOSIS — R079 Chest pain, unspecified: Secondary | ICD-10-CM | POA: Insufficient documentation

## 2023-04-03 DIAGNOSIS — E1065 Type 1 diabetes mellitus with hyperglycemia: Secondary | ICD-10-CM | POA: Insufficient documentation

## 2023-04-03 DIAGNOSIS — R739 Hyperglycemia, unspecified: Secondary | ICD-10-CM

## 2023-04-03 LAB — CBC
HCT: 37.9 % (ref 36.0–46.0)
Hemoglobin: 12.4 g/dL (ref 12.0–15.0)
MCH: 29.5 pg (ref 26.0–34.0)
MCHC: 32.7 g/dL (ref 30.0–36.0)
MCV: 90 fL (ref 80.0–100.0)
Platelets: 351 10*3/uL (ref 150–400)
RBC: 4.21 MIL/uL (ref 3.87–5.11)
RDW: 11.9 % (ref 11.5–15.5)
WBC: 9.5 10*3/uL (ref 4.0–10.5)
nRBC: 0 % (ref 0.0–0.2)

## 2023-04-03 LAB — BETA-HYDROXYBUTYRIC ACID: Beta-Hydroxybutyric Acid: 0.08 mmol/L (ref 0.05–0.27)

## 2023-04-03 LAB — BASIC METABOLIC PANEL
Anion gap: 10 (ref 5–15)
BUN: 13 mg/dL (ref 6–20)
CO2: 24 mmol/L (ref 22–32)
Calcium: 9.1 mg/dL (ref 8.9–10.3)
Chloride: 99 mmol/L (ref 98–111)
Creatinine, Ser: 0.6 mg/dL (ref 0.44–1.00)
GFR, Estimated: >60 mL/min (ref >60)
Glucose, Bld: 252 mg/dL — ABNORMAL HIGH (ref 70–99)
Potassium: 4.2 mmol/L (ref 3.5–5.1)
Sodium: 133 mmol/L — ABNORMAL LOW (ref 135–145)

## 2023-04-03 LAB — TROPONIN I (HIGH SENSITIVITY): Troponin I (High Sensitivity): <2 ng/L (ref <18)

## 2023-04-03 NOTE — ED Provider Notes (Signed)
 [facility] Provider Note    Event Date/Time   First MD Initiated Contact with Patient 04/03/23 1225     (approximate)   History   Chest Pain and Hyperglycemia   HPI Stacy George is a 28 y.o. female with DM1 presenting today for chest pain.  Patient states over the past 3 weeks she has had brief intermittent episodes of central chest pain.  Last for couple seconds before going away.  Does feel like movement exacerbates the symptoms but no other obvious alleviating or aggravating factors.  Does states she has a history of anxiety and was unsure if that was related.  Otherwise denied shortness of breath at this time.  No recent fevers, cough, congestion.  Has chronic nausea with no change to that.  Denies dysuria, leg pain, leg swelling.  Separately, she has also noted her blood sugar has been higher recently and wanted to make sure this was not related to her diabetes.     Physical Exam   Triage Vital Signs: ED Triage Vitals  Encounter Vitals Group     BP 04/03/23 1017 118/80     Systolic BP Percentile --      Diastolic BP Percentile --      Pulse Rate 04/03/23 1017 73     Resp 04/03/23 1017 17     Temp 04/03/23 1017 98.2 F (36.8 C)     Temp Source 04/03/23 1017 Oral     SpO2 04/03/23 1017 100 %     Weight 04/03/23 1018 121 lb (54.9 kg)     Height 04/03/23 1018 5' 5 (1.651 m)     Head Circumference --      Peak Flow --      Pain Score 04/03/23 1021 7     Pain Loc --      Pain Education --      Exclude from Growth Chart --     Most recent vital signs: Vitals:   04/03/23 1017  BP: 118/80  Pulse: 73  Resp: 17  Temp: 98.2 F (36.8 C)  SpO2: 100%   Physical Exam: I have reviewed the vital signs and nursing notes. General: Awake, alert, no acute distress.  Nontoxic appearing. Head:  Atraumatic, normocephalic.   ENT:  EOM intact, PERRL. Oral mucosa is pink and moist with no lesions. Neck: Neck is supple with full range of motion, No  meningeal signs. Cardiovascular:  RRR, No murmurs. Peripheral pulses palpable and equal bilaterally. Respiratory:  Symmetrical chest wall expansion.  No rhonchi, rales, or wheezes.  Good air movement throughout.  No use of accessory muscles.   Musculoskeletal:  No cyanosis or edema. Moving extremities with full ROM Abdomen:  Soft, nontender, nondistended. Neuro:  GCS 15, moving all four extremities, interacting appropriately. Speech clear. Psych:  Calm, appropriate.   Skin:  Warm, dry, no rash.    ED Results / Procedures / Treatments   Labs (all labs ordered are listed, but only abnormal results are displayed) Labs Reviewed  BASIC METABOLIC PANEL - Abnormal; Notable for the following components:      Result Value   Sodium 133 (*)    Glucose, Bld 252 (*)    All other components within normal limits  CBC  BETA-HYDROXYBUTYRIC ACID  POC URINE PREG, ED  TROPONIN I (HIGH SENSITIVITY)  TROPONIN I (HIGH SENSITIVITY)     EKG My EKG interpretation: Rate of 67, normal sinus rhythm, normal axis, normal intervals.  No acute ST elevations or depressions  RADIOLOGY Independently interpreted chest x-ray with no acute pathology   PROCEDURES:  Critical Care performed: No  Procedures   MEDICATIONS ORDERED IN ED: Medications - No data to display   IMPRESSION / MDM / ASSESSMENT AND PLAN / ED COURSE  I reviewed the triage vital signs and the nursing notes.                              Differential diagnosis includes, but is not limited to, pneumonia, low suspicion ACS, reflux, anxiety, electrolyte abnormality, DKA  Patient's presentation is most consistent with acute complicated illness / injury requiring diagnostic workup.  Patient is a 28 year old female presenting today for 3 weeks of very brief chest pain symptoms also associated with chronic nausea.  EKG unremarkable and troponin negative.  No concern for ACS or cardiac etiology.  BMP and CBC largely unremarkable aside from  hyperglycemia but no evidence of DKA.  Beta hydroxybutyrate negative.  Chest x-ray with no evidence of pneumonia or other acute cardiopulmonary abnormalities.  Patient's vital signs were stable and physical exam unremarkable.  Low concern for emergent or life-threatening pathology at this time.  Patient safe for discharge and will follow-up with PCP for ongoing monitoring.     FINAL CLINICAL IMPRESSION(S) / ED DIAGNOSES   Final diagnoses:  Hyperglycemia  Chest pain, unspecified type     Rx / DC Orders   ED Discharge Orders     None        Note:  This document was prepared using Dragon voice recognition software and may include unintentional dictation errors.   Malvina Alm DASEN, MD 04/03/23 1247

## 2023-04-03 NOTE — Assessment & Plan Note (Signed)
 Still working in therapy Continues with prozac  50 mg and klonopin  1-2 times daily. Discussed need for exposure therapy w/ driving  Sad we will no longer have a working relationship -- but happy for her to move and be closer to family!  Will provide care/refills needed through the next few months.

## 2023-04-03 NOTE — ED Notes (Signed)
 See first nurse note.

## 2023-04-03 NOTE — Assessment & Plan Note (Signed)
 Will refill insulin as needed Referring to an endo in [place]

## 2023-04-03 NOTE — ED Triage Notes (Signed)
 Pt here with cp and hyperglycemia. Pt states she was getting blood sugar readings in the 300s while she was sleeping last night. Pt states pain is generalized and sharp that radiates to her back. Pt states she has a hx of low vitamin D  deficiency. Pt endorses severe nausea and diarrhea but denies vomiting.

## 2023-04-03 NOTE — ED Notes (Signed)
 D/C and reason to return dicussed with pt, pt verbalized understanding. NAD noted. Pt ambulatory with steady gait on D/C.S/O with pt.

## 2023-04-15 ENCOUNTER — Telehealth: Payer: Self-pay

## 2023-04-15 ENCOUNTER — Other Ambulatory Visit: Payer: Self-pay | Admitting: Physician Assistant

## 2023-04-15 DIAGNOSIS — F410 Panic disorder [episodic paroxysmal anxiety] without agoraphobia: Secondary | ICD-10-CM

## 2023-04-15 NOTE — Telephone Encounter (Signed)
 Fax sent  for medication refill   [facility]- [address] [place]     Requesting: clonazePAM  (KLONOPIN ) 1 MG tablet [[id number]]  Contract: -- UDS: -- Last Visit: 04/02/23 VV Next Visit: Visit date not found Last Refill: 04/02/23  Please Advise

## 2023-04-19 NOTE — Progress Notes (Unsigned)
[facility] Initial Consultation   Referring Provider Pollyann Savoy, MD [address] [place],  [place] [address]  Primary Care Provider [name], PA-C  Patient Profile: Stacy George is a 28 y.o. female with a past medical history noteworthy for T1DM, joint pain, anxiety and panic disorder who is seen in consultation in the [facility] at the request of Dr. Corliss Skains for evaluation and management of the problem(s) noted below.  Problem List: 1.  Alternating diarrhea and constipation 2.  Generalized abdominal pain 3.  Nausea 4.  Chest pain 5.  Weight loss   History of Present Illness   Stacy George is a 28 y.o. female with a history of T1DM, joint pain, anxiety and panic disorder who presents to the office for evaluation and management of alternating constipation and diarrhea, generalized abdominal pain, nausea, chest pain and weight loss.  In speaking with Xoie as well as reviewing her prior medical records she reports a history of nausea and altered bowel habits dating back to high school.  Vacillates between constipation and diarrhea but more often constipated Can skip going to the bathroom for up to 5 days and then will take a laxative to instigate bowel movements When she is not constipated has loose, watery stools without blood or mucus Denies fecal urgency, tenesmus or nocturnal bowel movements  Endorses generalized abdominal pain that starts in the mid abdomen and emanates throughout On exam today she is most tender in the left lower quadrant States that she was told she could potentially have endometriosis  Also endorses upper symptoms of chest pain that she does not feel is cardiac in origin Denies regurgitation but may have heartburn Chronically nauseated but no vomiting Takes Tums with mild benefit and OTC antacids Has not been on a PPI  Recent history is also noteworthy for approximately 15 pound weight loss  over the last 6 months Summer 2024 she weighed 134 pounds -today is 118 pounds Relates that her weight loss is unintentional -feels the pain and discomfort she is experiencing is causing her weight loss  In terms of her current diet she does not typically eat breakfast Picks healthy food choices such as salmon, rice and broccoli Notes that certain bland foods have too many carbohydrates and she cannot eat them because of her diabetes In light of her weight loss she has been consuming 1-2 Atkins protein shakes a day   No previous GI evaluation  Currently on an insulin pump for diabetes management and states that it has been generally regulating her glucose as well  No known family history of celiac disease, inflammatory bowel disease, colorectal cancer Relates that her mother had problems with nausea which improved after hysterectomy  Daisy also comments that she will be moving back home to [place] in mid February due to her anxiety issues   Last colonoscopy: None Last endoscopy: None  Last Abd CT/CTE/MRE: None  GI Review of Symptoms Significant for abdominal pain, nausea, diarrhea, constipation, chest pain. Otherwise negative.  General Review of Systems  Review of systems is significant for the pertinent positives and negatives as listed per the HPI.  Full ROS is otherwise negative.  Past Medical History   Past Medical History:  Diagnosis Date   Anxiety    Diabetes mellitus without complication (HCC)    DM type 1   Migraines    Periodontal disease    Seasonal allergies      Past Surgical History   Past Surgical History:  Procedure  Laterality Date   KNEE SURGERY Left    cyst and fluid removed from behind knee cap   NASAL POLYP EXCISION     periodontal surgery     WISDOM TOOTH EXTRACTION       Allergies and Medications   Allergies  Allergen Reactions   Other     Environmental allergies    Current Meds  Medication Sig   albuterol (VENTOLIN HFA) 108  (90 Base) MCG/ACT inhaler Inhale 1-2 puffs into the lungs every 6 (six) hours as needed.   benzonatate (TESSALON) 100 MG capsule Take 1-2 capsules (100-200 mg total) by mouth 3 (three) times daily as needed.   budesonide-formoterol (SYMBICORT) 80-4.5 MCG/ACT inhaler Inhale 2 puffs into the lungs in the morning and at bedtime.   busPIRone (BUSPAR) 5 MG tablet Take 1 tablet (5 mg total) by mouth 2 (two) times daily.   clonazePAM (KLONOPIN) 1 MG tablet Take 1 tablet (1 mg total) by mouth 3 (three) times daily as needed for anxiety.   Continuous Glucose Sensor (DEXCOM G6 SENSOR) MISC Change every 10 days to continuously monitor blood sugar   Continuous Glucose Transmitter (DEXCOM G6 TRANSMITTER) MISC Use with dexcom system, change every 3 months   FLUoxetine (PROZAC) 10 MG capsule Take 1 capsule (10 mg total) by mouth daily.   FLUoxetine (PROZAC) 40 MG capsule Take 1 capsule (40 mg total) by mouth daily.   fluticasone (FLONASE) 50 MCG/ACT nasal spray Place 2 sprays into both nostrils daily.   insulin detemir (LEVEMIR) 100 UNIT/ML FlexPen Inject 10 Units into the skin at bedtime.   Insulin Disposable Pump (OMNIPOD 5 G6 PODS, GEN 5,) MISC Change pods every 72 hours   insulin lispro (HUMALOG) 200 UNIT/ML KwikPen Before meals: 121-150: 1 unit, 151-200: 2 units, 201-250: 3 units, 251-300: 5 units, 301-350: 7 units, 351-400: 9 units      Bedtime: 121-200: 0 units, 201-250: 2 units, 251-300: 3 units, 301-350: 4 units, 351-400: 5 units   Insulin Pen Needle (PEN NEEDLES) 31G X 5 MM MISC Check blood sugar 4 times daily, before meals, fasting, and before bed   ondansetron (ZOFRAN) 4 MG tablet Take 1 tablet (4 mg total) by mouth every 8 (eight) hours as needed for nausea or vomiting.   pantoprazole (PROTONIX) 40 MG tablet Take 1 tablet (40 mg total) by mouth daily.   Vitamin D, Ergocalciferol, (DRISDOL) 1.25 MG (50000 UNIT) CAPS capsule Take 1 capsule (50,000 Units total) by mouth 2 (two) times a week.     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 Her smoking use included cigarettes and e-cigarettes. She started smoking about 7 years ago. She has been exposed to tobacco smoke. She has never used smokeless tobacco. She reports current alcohol use of about 1.0 standard drink of alcohol per week. She reports current drug  use. Drug: Marijuana.  Vital Signs and Physical Examination   Vitals:   04/20/23 0904  BP: 98/70  Pulse: 79  Height: 5\' 5"  (1.651 m)  Weight: 118 lb (53.5 kg)  BMI (Calculated): 19.64    General: Thin, no acute distress Head: Normocephalic and atraumatic Eyes: Sclerae anicteric Ears: Normal auditory acuity Mouth: No deformities or lesions noted Lungs: Clear throughout to auscultation Heart: Regular rate and rhythm; No murmurs, rubs or bruits Abdomen: Soft, diffusely tender to palpation in all 4 quadrants with maximal tenderness in the left lower quadrant, no masses, hepatosplenomegaly or hernias noted. Normal Bowel sounds Rectal:Deferred Musculoskeletal: Symmetrical with no gross deformities  Pulses:  Normal pulses noted Extremities: No edema or deformities noted Neurological: Alert oriented x 4, grossly nonfocal Psychological:  Alert and cooperative. Normal mood and affect  Review of Data  The following data was reviewed at the time of this encounter:  Laboratory Studies      Latest  Ref Rng & Units 04/03/2023   10:24 AM 02/12/2023   10:09 AM 01/09/2023    6:42 PM  CBC  WBC 4.0 - 10.5 K/uL 9.5  7.4  7.0   Hemoglobin 12.0 - 15.0 g/dL 82.9  56.2  13.0   Hematocrit 36.0 - 46.0 % 37.9  39.8  37.0   Platelets 150 - 400 K/uL 351  398.0  350     Lab Results  Component Value Date   LIPASE 24 09/18/2021      Latest Ref Rng & Units 04/03/2023   10:24 AM 02/25/2023   11:48 AM 02/12/2023   10:09 AM  CMP  Glucose 70 - 99 mg/dL 865   784   BUN 6 - 20 mg/dL 13   10   Creatinine 6.96 - 1.00 mg/dL 2.95   2.84   Sodium 132 - 145 mmol/L 133   134   Potassium 3.5 - 5.1 mmol/L 4.2   4.2   Chloride 98 - 111 mmol/L 99   102   CO2 22 - 32 mmol/L 24   25   Calcium 8.9 - 10.3 mg/dL 9.1   9.2   Total Protein 6.1 - 8.1 g/dL  7.9  7.0   Total Bilirubin 0.2 - 1.2 mg/dL   0.5   Alkaline Phos 39 - 117 U/L   63   AST 0 - 37 U/L   10   ALT 0 - 35 U/L   9     Imaging Studies  None  GI Procedures and Studies  None    Clinical Impression  It is my clinical impression that Ms. Roccaforte is a 28 y.o. female with;  1.  Alternating diarrhea and constipation 2.  Generalized abdominal pain 3.  Nausea 4.  Chest pain 5.  Weight loss  Tashema presents for evaluation of chronic gastrointestinal symptoms including generalized abdominal pain, alternating diarrhea and constipation, nausea chest pain and weight loss.  She relates that her nausea and alternating bowel habits date back to high school.  She tends more towards constipation than diarrhea.  No blood or mucus in her stool.  Nausea seems to have increased over the years but not associated with vomiting.  It is noteworthy that she has had a progressive 15 pound weight loss over the last 6 months which she attributes to difficulty eating due to her GI symptoms.  At today's visit we discussed that the differential diagnosis for her symptoms could include GERD, gastritis, H. pylori infection, peptic ulcer disease, celiac disease, Crohn's  disease, gastrointestinal dysmotility, irritable bowel syndrome, constipation with overflow encopresis a hepatobiliary disorder, pancreatic disorder or GYN etiology.  For further investigation of her symptoms I have advised laboratory testing today as well as cross-sectional imaging in the form of a CT scan of the abdomen and pelvis given her concerning weight loss.  Plan  Labs today: ESR, CRP, celiac panel, lipase, iron panel, folate, vitamin B12 Stool studies for H. pylori and fecal calprotectin assessment CT scan of the abdomen pelvis with contrast for evaluation of chronic abdominal pain and weight loss Recommended trialing a low-dose laxative on a daily basis such as MiraLAX 1/2-1 cap daily to see if this regulates bowel movements and ameliorates symptoms.  Advised that if constipation persists despite OTC agents we can consider prescription agents such as Linzess, Motegrity or Trulance After collecting H. pylori stool antigen start pantoprazole 40 mg orally daily 30 minutes before meal Continue diabetic diet and 1-2 Atkins protein supplements a day Monitor weight and anthropometrics  Planned Follow Up PRN - Alythia will be moving back to [place] in 1 month.  I will apprise her of the laboratory and imaging results when they return so that she will be aware if there are any issues that require follow-up when she moves back home.  The patient or caregiver verbalized understanding of the material covered, with no barriers to understanding. All questions were answered. Patient or caregiver is agreeable with the plan outlined above.    It was a pleasure to see Lynnleigh.  If you have any questions or concerns regarding this evaluation, do not hesitate to contact me.  Maren Beach, MD [facility]  I spent total of 30 minutes in both face-to-face and non-face-to-face activities, excluding procedures performed, for the visit on the date of this encounter.

## 2023-04-20 ENCOUNTER — Other Ambulatory Visit (INDEPENDENT_AMBULATORY_CARE_PROVIDER_SITE_OTHER): Payer: 59

## 2023-04-20 ENCOUNTER — Encounter: Payer: Self-pay | Admitting: Pediatrics

## 2023-04-20 ENCOUNTER — Ambulatory Visit: Payer: 59 | Admitting: Pediatrics

## 2023-04-20 VITALS — BP 98/70 | HR 79 | Ht 65.0 in | Wt 118.0 lb

## 2023-04-20 DIAGNOSIS — R110 Nausea: Secondary | ICD-10-CM

## 2023-04-20 DIAGNOSIS — R198 Other specified symptoms and signs involving the digestive system and abdomen: Secondary | ICD-10-CM

## 2023-04-20 DIAGNOSIS — R194 Change in bowel habit: Secondary | ICD-10-CM | POA: Diagnosis not present

## 2023-04-20 DIAGNOSIS — R079 Chest pain, unspecified: Secondary | ICD-10-CM

## 2023-04-20 DIAGNOSIS — R112 Nausea with vomiting, unspecified: Secondary | ICD-10-CM

## 2023-04-20 DIAGNOSIS — R634 Abnormal weight loss: Secondary | ICD-10-CM

## 2023-04-20 DIAGNOSIS — R1084 Generalized abdominal pain: Secondary | ICD-10-CM | POA: Diagnosis not present

## 2023-04-20 LAB — LIPASE: Lipase: 5 U/L — ABNORMAL LOW (ref 11.0–59.0)

## 2023-04-20 LAB — IBC + FERRITIN
Ferritin: 63.1 ng/mL (ref 10.0–291.0)
Iron: 121 ug/dL (ref 42–145)
Saturation Ratios: 43.4 % (ref 20.0–50.0)
TIBC: 278.6 ug/dL (ref 250.0–450.0)
Transferrin: 199 mg/dL — ABNORMAL LOW (ref 212.0–360.0)

## 2023-04-20 LAB — SEDIMENTATION RATE: Sed Rate: 1 mm/h (ref 0–20)

## 2023-04-20 LAB — C-REACTIVE PROTEIN: CRP: <1 mg/dL (ref 0.5–20.0)

## 2023-04-20 LAB — B12 AND FOLATE PANEL
Folate: 9.4 ng/mL (ref >5.9)
Vitamin B-12: 567 pg/mL (ref 211–911)

## 2023-04-20 MED ORDER — PANTOPRAZOLE SODIUM 40 MG PO TBEC: 40.0000 mg | DELAYED_RELEASE_TABLET | Freq: Every day | ORAL | 3 refills | Status: AC

## 2023-04-20 NOTE — Patient Instructions (Signed)
_______________________________________________________  If your blood pressure at your visit was 140/90 or greater, please contact your primary care physician to follow up on this.  _______________________________________________________  If you are age 26 or older, your body mass index should be between 23-30. Your Body mass index is 19.64 kg/m. If this is out of the aforementioned range listed, please consider follow up with your Primary Care Provider.  If you are age 80 or younger, your body mass index should be between 19-25. Your Body mass index is 19.64 kg/m. If this is out of the aformentioned range listed, please consider follow up with your Primary Care Provider.   ________________________________________________________  The [facility] providers would like to encourage you to use [facility] to communicate with providers for non-urgent requests or questions.  Due to long hold times on the telephone, sending your provider a message by [facility] may be a faster and more efficient way to get a response.  Please allow 48 business hours for a response.  Please remember that this is for non-urgent requests.  _______________________________________________________  We have sent the following medications to your pharmacy for you to pick up at your convenience:  START: pantoprazole 40mg  one tablet 30 minutes prior to breakfast meal each day.  Your provider has requested that you go to the basement level for lab work before leaving today. Press "B" on the elevator. The lab is located at the first door on the left as you exit the elevator.  You have been scheduled for a CT scan of the abdomen and pelvis at [facility], 1st floor Radiology. You are scheduled on 04-30-23 at 1pm. You should arrive at 10:45am for registration and to drink contrast at 11am and 12pm. .  Please follow the written instructions below on the day of your exam:   1) Do not eat anything after 9am (4 hours prior to  your test)   You may take any medications as prescribed with a small amount of water, if necessary. If you take any of the following medications: METFORMIN, GLUCOPHAGE, GLUCOVANCE, AVANDAMET, RIOMET, FORTAMET, ACTOPLUS MET, JANUMET, GLUMETZA or METAGLIP, you MAY be asked to HOLD this medication 48 hours AFTER the exam.   The purpose of you drinking the oral contrast is to aid in the visualization of your intestinal tract. The contrast solution may cause some diarrhea. Depending on your individual set of symptoms, you may also receive an intravenous injection of x-ray contrast/dye. Plan on being at [facility] for 45 minutes or longer, depending on the type of exam you are having performed.   If you have any questions regarding your exam or if you need to reschedule, you may call [facility] at [phone] between the hours of 8:00 am and 5:00 pm, Monday-Friday.   Due to recent changes in healthcare laws, you may see the results of your imaging and laboratory studies on MyChart before your provider has had a chance to review them.  We understand that in some cases there may be results that are confusing or concerning to you. Not all laboratory results come back in the same time frame and the provider may be waiting for multiple results in order to interpret others.  Please give [place] 48 hours in order for your provider to thoroughly review all the results before contacting the office for clarification of your results.   Thank you for entrusting me with your care and choosing [facility].  Dr Doy Hutching

## 2023-04-21 ENCOUNTER — Encounter: Payer: Self-pay | Admitting: Pediatrics

## 2023-04-21 LAB — GLIA (IGA/G) + TTG IGA
Antigliadin Abs, IgA: 3 U (ref 0–19)
Gliadin IgG: 7 U (ref 0–19)
Transglutaminase IgA: <2 U/mL (ref 0–3)

## 2023-04-28 ENCOUNTER — Telehealth: Payer: Self-pay | Admitting: Pediatrics

## 2023-04-28 NOTE — Telephone Encounter (Signed)
Left message advising patient if she has frozen her stool specimen as directed by the lab, she may bring the frozen specimen to the lab. If not, we will have to have her recollect. Asked that she let me know if she has additional questions or concerns.

## 2023-04-28 NOTE — Telephone Encounter (Signed)
Patient is requesting a call back for fecal test stated she completed them on Saturday and wanted to know if she can bring the test into the lab.

## 2023-04-30 ENCOUNTER — Other Ambulatory Visit: Payer: 59

## 2023-04-30 ENCOUNTER — Ambulatory Visit (HOSPITAL_COMMUNITY)
Admission: RE | Admit: 2023-04-30 | Discharge: 2023-04-30 | Disposition: A | Discharge status: Home / Self Care | Payer: 59 | Source: Ambulatory Visit | Attending: Pediatrics | Admitting: Pediatrics

## 2023-04-30 DIAGNOSIS — R1084 Generalized abdominal pain: Secondary | ICD-10-CM | POA: Insufficient documentation

## 2023-04-30 DIAGNOSIS — R634 Abnormal weight loss: Secondary | ICD-10-CM

## 2023-04-30 DIAGNOSIS — R198 Other specified symptoms and signs involving the digestive system and abdomen: Secondary | ICD-10-CM | POA: Diagnosis present

## 2023-04-30 MED ORDER — IOHEXOL 300 MG/ML  SOLN
30.0000 mL | Freq: Once | INTRAMUSCULAR | Status: AC | PRN
Start: 2023-04-30 — End: 2023-04-30
  Administered 2023-04-30: 30 mL via ORAL

## 2023-04-30 MED ORDER — IOHEXOL 300 MG/ML  SOLN
100.0000 mL | Freq: Once | INTRAMUSCULAR | Status: AC | PRN
  Administered 2023-04-30: 100 mL via INTRAVENOUS

## 2023-05-02 LAB — CALPROTECTIN, FECAL: Calprotectin, Fecal: 21 ug/g (ref 0–120)

## 2023-05-02 LAB — H. PYLORI ANTIGEN, STOOL: H pylori Ag, Stl: NEGATIVE

## 2023-05-11 ENCOUNTER — Encounter: Payer: Self-pay | Admitting: Physician Assistant

## 2023-05-12 ENCOUNTER — Encounter: Payer: Self-pay | Admitting: Pediatrics

## 2023-05-14 ENCOUNTER — Telehealth: Payer: Self-pay

## 2023-05-14 ENCOUNTER — Encounter: Payer: Self-pay | Admitting: Physician Assistant

## 2023-05-14 ENCOUNTER — Telehealth: Payer: 59 | Admitting: Physician Assistant

## 2023-05-14 ENCOUNTER — Ambulatory Visit: Payer: 59 | Admitting: Physician Assistant

## 2023-05-14 DIAGNOSIS — E109 Type 1 diabetes mellitus without complications: Secondary | ICD-10-CM | POA: Diagnosis not present

## 2023-05-14 DIAGNOSIS — F410 Panic disorder [episodic paroxysmal anxiety] without agoraphobia: Secondary | ICD-10-CM | POA: Diagnosis not present

## 2023-05-14 MED ORDER — OMNIPOD 5 DEXG7G6 PODS GEN 5 MISC: 12 refills | Status: AC

## 2023-05-14 MED ORDER — INSULIN LISPRO 200 UNIT/ML ~~LOC~~ SOPN: PEN_INJECTOR | SUBCUTANEOUS | 11 refills | Status: AC

## 2023-05-14 MED ORDER — INSULIN DETEMIR 100 UNIT/ML FLEXPEN: 10.0000 [IU] | PEN_INJECTOR | Freq: Every day | SUBCUTANEOUS | 2 refills | Status: AC

## 2023-05-14 MED ORDER — PEN NEEDLES 31G X 5 MM MISC: 3 refills | Status: AC

## 2023-05-14 MED ORDER — DEXCOM G6 SENSOR MISC: 12 refills | Status: AC

## 2023-05-14 MED ORDER — CLONAZEPAM 1 MG PO TABS: 1.0000 mg | ORAL_TABLET | Freq: Three times a day (TID) | ORAL | 0 refills | Status: DC | PRN

## 2023-05-14 NOTE — Telephone Encounter (Signed)
Received fax from [facility]. Levemir has been discontinued.

## 2023-05-14 NOTE — Progress Notes (Addendum)
 MyChart Video Visit    Virtual Visit via Video Note   This format is felt to be most appropriate for this patient at this time. Physical exam was limited by quality of the video and audio technology used for the visit.   Patient location: at work, private location Provider location: [email or web address]  I discussed the limitations of evaluation and management by telemedicine and the availability of in person appointments. The patient expressed understanding and agreed to proceed.  Patient: Stacy George   DOB: 25-Aug-1995   27 y.o. Female  MRN: [id number] Visit Date: 05/14/2023  Today's healthcare provider: [name], PA-C   Cc. Fmla paperwork   Subjective    HPI   Pt reports overall doing well, last 1-2 weeks with stressors as she is moving! To [employer].   She was down to taking klonopin once daily, sporadic panic attacks. Able to drive to work-- with difficulty but she is doing it.    Medications: Outpatient Medications Prior to Visit  Medication Sig   albuterol (VENTOLIN HFA) 108 (90 Base) MCG/ACT inhaler Inhale 1-2 puffs into the lungs every 6 (six) hours as needed.   Albuterol-Budesonide (AIRSUPRA) 90-80 MCG/ACT AERO Inhale 2 puffs into the lungs every 4 (four) hours as needed. (Patient not taking: Reported on 04/20/2023)   benzonatate (TESSALON) 100 MG capsule Take 1-2 capsules (100-200 mg total) by mouth 3 (three) times daily as needed.   budesonide-formoterol (SYMBICORT) 80-4.5 MCG/ACT inhaler Inhale 2 puffs into the lungs in the morning and at bedtime.   busPIRone (BUSPAR) 5 MG tablet Take 1 tablet (5 mg total) by mouth 2 (two) times daily.   Continuous Glucose Transmitter (DEXCOM G6 TRANSMITTER) MISC Use with dexcom system, change every 3 months   FLUoxetine (PROZAC) 10 MG capsule Take 1 capsule (10 mg total) by mouth daily.   FLUoxetine (PROZAC) 40 MG capsule Take 1 capsule (40 mg total) by mouth daily.   fluticasone (FLONASE) 50 MCG/ACT nasal spray Place 2  sprays into both nostrils daily.   ondansetron (ZOFRAN) 4 MG tablet Take 1 tablet (4 mg total) by mouth every 8 (eight) hours as needed for nausea or vomiting.   pantoprazole (PROTONIX) 40 MG tablet Take 1 tablet (40 mg total) by mouth daily.   Vitamin D, Ergocalciferol, (DRISDOL) 1.25 MG (50000 UNIT) CAPS capsule Take 1 capsule (50,000 Units total) by mouth 2 (two) times a week.   [DISCONTINUED] clonazePAM (KLONOPIN) 1 MG tablet Take 1 tablet (1 mg total) by mouth 3 (three) times daily as needed for anxiety.   [DISCONTINUED] Continuous Glucose Sensor (DEXCOM G6 SENSOR) MISC Change every 10 days to continuously monitor blood sugar   [DISCONTINUED] insulin detemir (LEVEMIR) 100 UNIT/ML FlexPen Inject 10 Units into the skin at bedtime.   [DISCONTINUED] Insulin Disposable Pump (OMNIPOD 5 G6 PODS, GEN 5,) MISC Change pods every 72 hours   [DISCONTINUED] insulin lispro (HUMALOG) 200 UNIT/ML KwikPen Before meals: 121-150: 1 unit, 151-200: 2 units, 201-250: 3 units, 251-300: 5 units, 301-350: 7 units, 351-400: 9 units      Bedtime: 121-200: 0 units, 201-250: 2 units, 251-300: 3 units, 301-350: 4 units, 351-400: 5 units   [DISCONTINUED] Insulin Pen Needle (PEN NEEDLES) 31G X 5 MM MISC Check blood sugar 4 times daily, before meals, fasting, and before bed   No facility-administered medications prior to visit.    Review of Systems  Constitutional:  Negative for fatigue and fever.  Respiratory:  Negative for cough and shortness of breath.  Cardiovascular:  Negative for chest pain and leg swelling.  Gastrointestinal:  Negative for abdominal pain.  Neurological:  Negative for dizziness and headaches.        Objective    There were no vitals taken for this visit.      Physical Exam Constitutional:      Appearance: Normal appearance. She is not ill-appearing.  Neurological:     Mental Status: She is oriented to person, place, and time.  Psychiatric:        Mood and Affect: Mood is anxious.         Behavior: Behavior normal.      Assessment & Plan     1. Panic disorder Reviewed her fmla papers --- 5 days/month, anywhere from 1 -12 (or whole shift) hours if she needs to take time to recover from panic attack.   Cont therapy, prozac 50 mg, and klonopin prn.  - clonazePAM (KLONOPIN) 1 MG tablet; Take 1 tablet (1 mg total) by mouth 3 (three) times daily as needed for anxiety.  Dispense: 90 tablet; Refill: 0  2. Type 1 diabetes mellitus without complication (HCC) Sent appropriate refills to [place]. Pt has appt with endo already scheduled there.   Allow for 5 days/ 1 mo for any health complications 2/2 to DMI   - Insulin Pen Needle (PEN NEEDLES) 31G X 5 MM MISC; Check blood sugar 4 times daily, before meals, fasting, and before bed  Dispense: 100 each; Refill: 3 - insulin lispro (HUMALOG) 200 UNIT/ML KwikPen; Before meals: 121-150: 1 unit, 151-200: 2 units, 201-250: 3 units, 251-300: 5 units, 301-350: 7 units, 351-400: 9 units      Bedtime: 121-200: 0 units, 201-250: 2 units, 251-300: 3 units, 301-350: 4 units, 351-400: 5 units  Dispense: 3 mL; Refill: 11 - insulin detemir (LEVEMIR) 100 UNIT/ML FlexPen; Inject 10 Units into the skin at bedtime.  Dispense: 15 mL; Refill: 2 - Insulin Disposable Pump (OMNIPOD 5 DEXG7G6 PODS GEN 5) MISC; Change pods every 72 hours  Dispense: 10 each; Refill: 12 - Continuous Glucose Sensor (DEXCOM G6 SENSOR) MISC; Change every 10 days to continuously monitor blood sugar  Dispense: 3 each; Refill: 12   I wish her well! Happy to provide refills x 6 months, pt already has an appt with endo scheduled and working on getting a PCP in [place].   I discussed the assessment and treatment plan with the patient. The patient was provided an opportunity to ask questions and all were answered. The patient agreed with the plan and demonstrated an understanding of the instructions.   The patient was advised to call back or seek an in-person evaluation if the symptoms worsen or  if the condition fails to improve as anticipated.  [name], PA-C [facility] at [facility] [phone] (phone) [phone] (fax)  [facility]

## 2023-06-23 IMAGING — DX DG CHEST 1V PORT
1 series · 1 of 1 positions shown · non-contrast
Comparison: None.

CLINICAL DATA: Nausea

EXAM:
PORTABLE CHEST 1 VIEW

[chest ap]
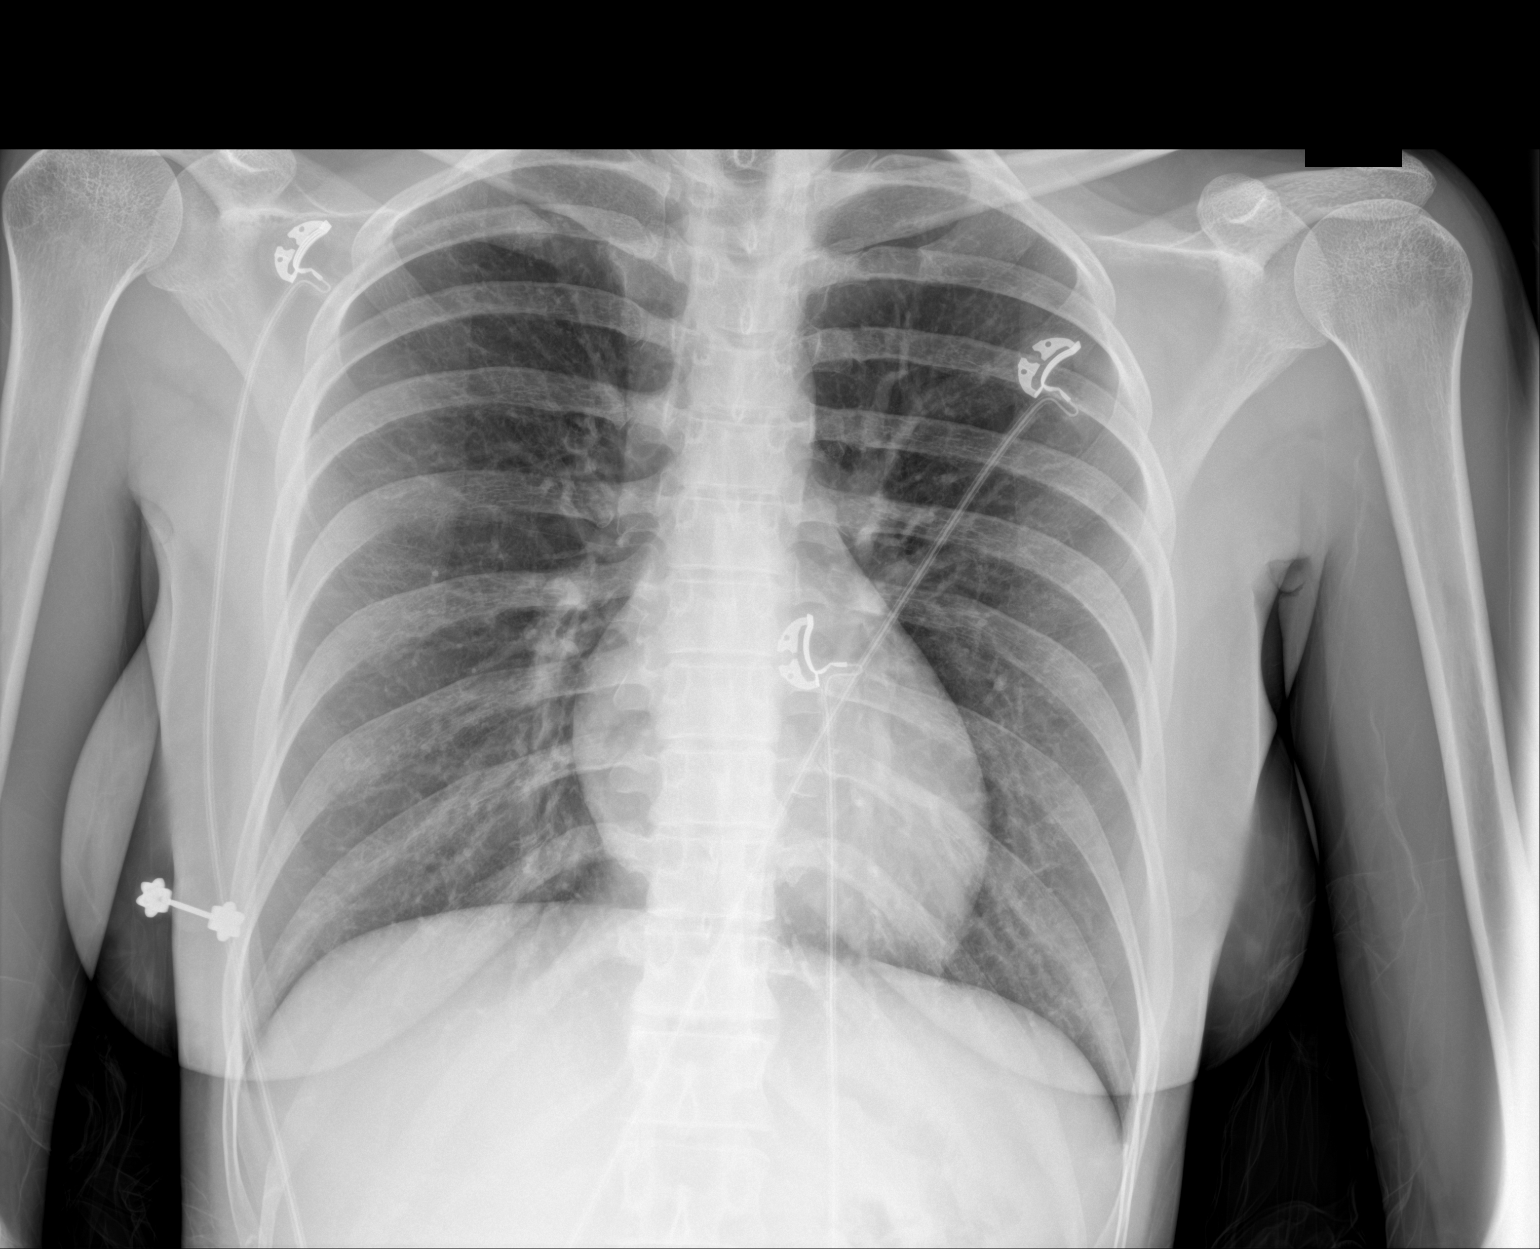

[1 of 1 positions shown; findings below may reference images not displayed]

FINDINGS: Cardiac and mediastinal contours are within normal limits. No focal
pulmonary opacity. No pleural effusion or pneumothorax. No acute
osseous abnormality.
IMPRESSION: No acute cardiopulmonary process.

## 2023-06-28 ENCOUNTER — Other Ambulatory Visit: Payer: Self-pay | Admitting: Physician Assistant

## 2023-06-28 DIAGNOSIS — F410 Panic disorder [episodic paroxysmal anxiety] without agoraphobia: Secondary | ICD-10-CM

## 2023-06-30 NOTE — Telephone Encounter (Signed)
 Requesting:  clonazepam 1mg   Contract: None UDS: None Last Visit: 05/14/23 Next Visit: None Last Refill: 05/14/23 #90 and 0RF   Please Advise

## 2023-10-05 IMAGING — US US PELVIS COMPLETE TRANSABD/TRANSVAG W DUPLEX AND/OR DOPPLER
1 series · 13 of 25 positions shown · non-contrast
Comparison: None Available.

CLINICAL DATA: Left lower quadrant pain for 1 day.

EXAM:
TRANSABDOMINAL AND TRANSVAGINAL ULTRASOUND OF PELVIS
DOPPLER ULTRASOUND OF OVARIES
TECHNIQUE: Both transabdominal and transvaginal ultrasound examinations of the
pelvis were performed. Transabdominal technique was performed for
global imaging of the pelvis including uterus, ovaries, adnexal
regions, and pelvic cul-de-sac. It was necessary to proceed with
endovaginal exam following the transabdominal exam to visualize the
endometrium.

[Series 1: us pelvic complete w transvaginal and torsion righ · 13 of 116 slices shown]
[im 1/116]
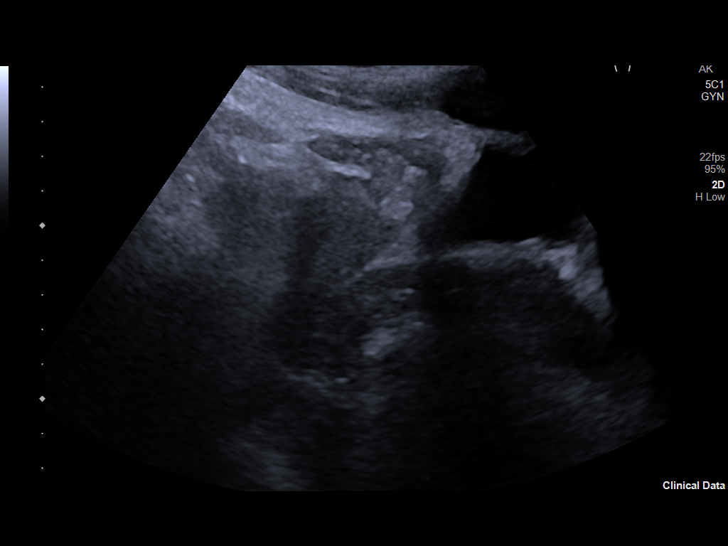
[im 10/116]
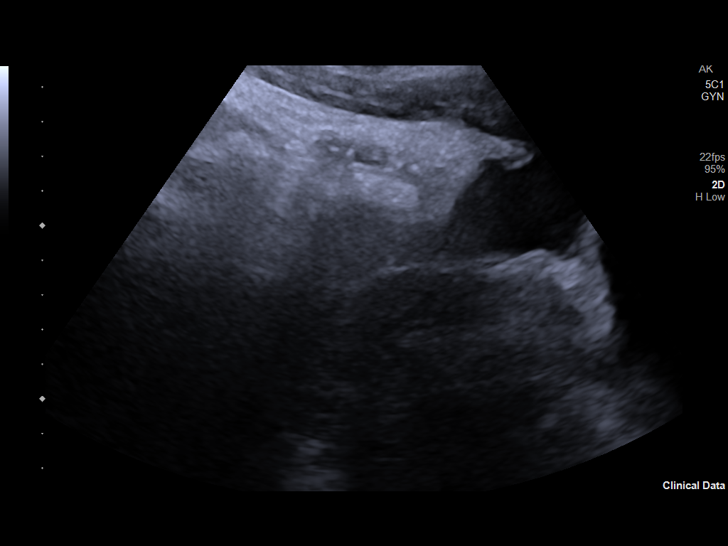
[im 20/116]
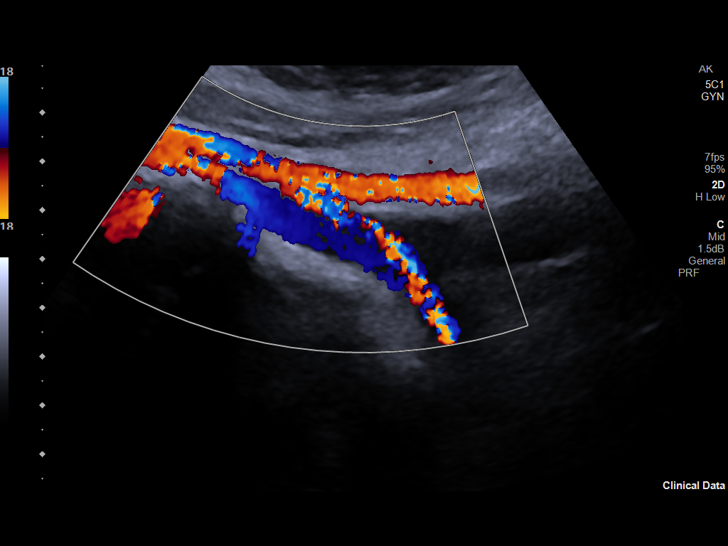
[im 29/116]
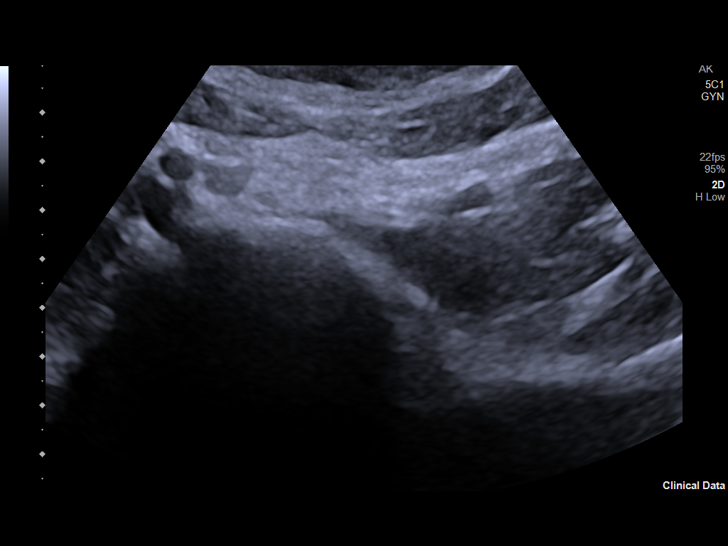
[im 39/116]
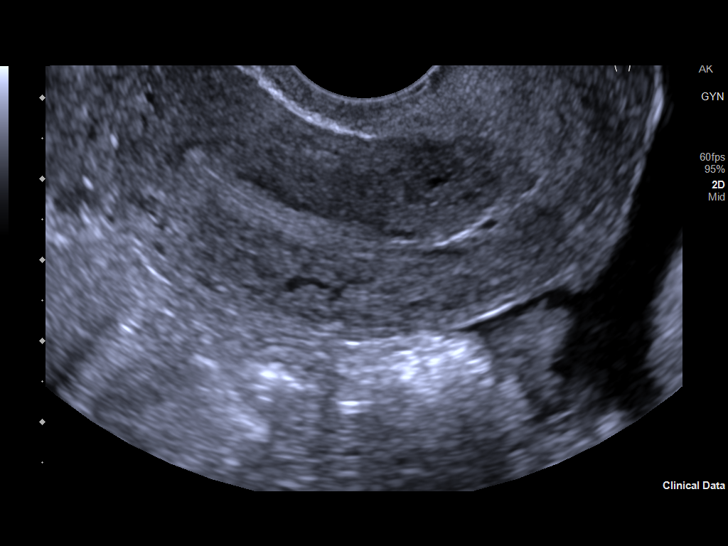
[im 48/116]
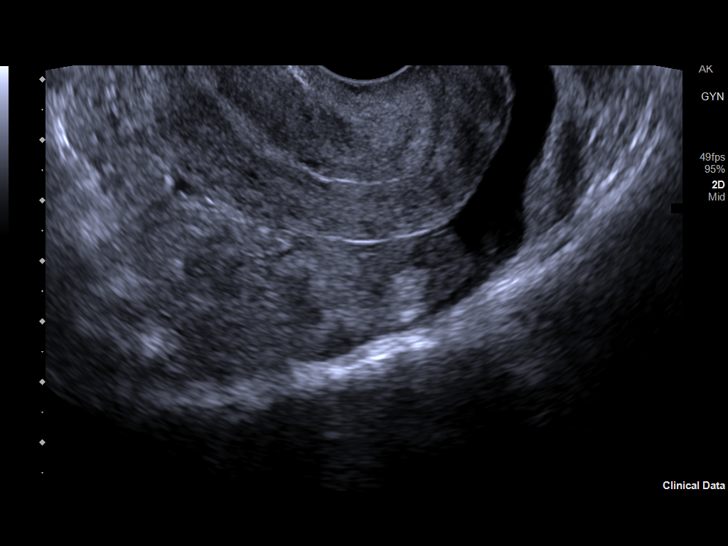
[im 58/116]
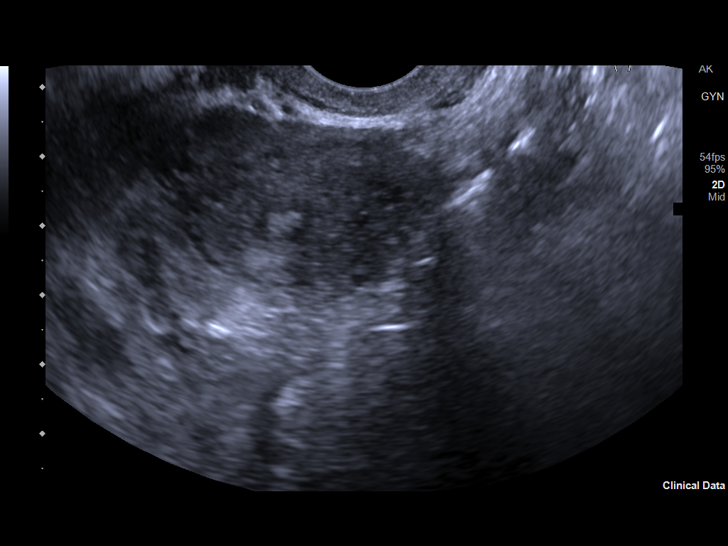
[im 68/116]
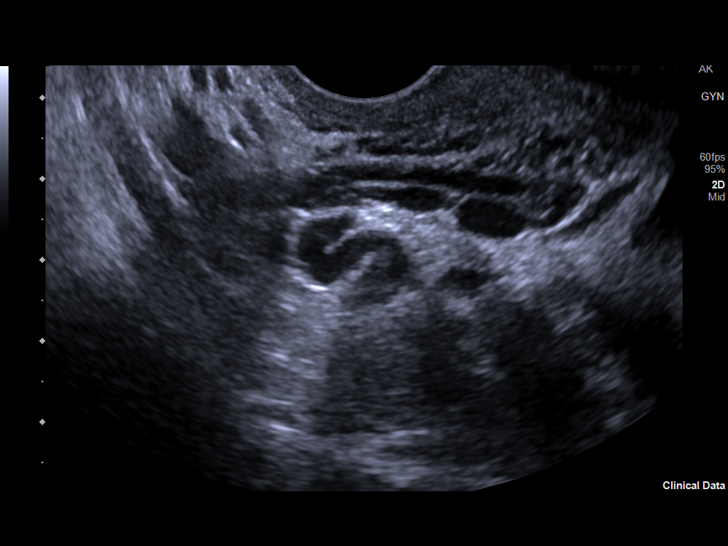
[im 77/116]
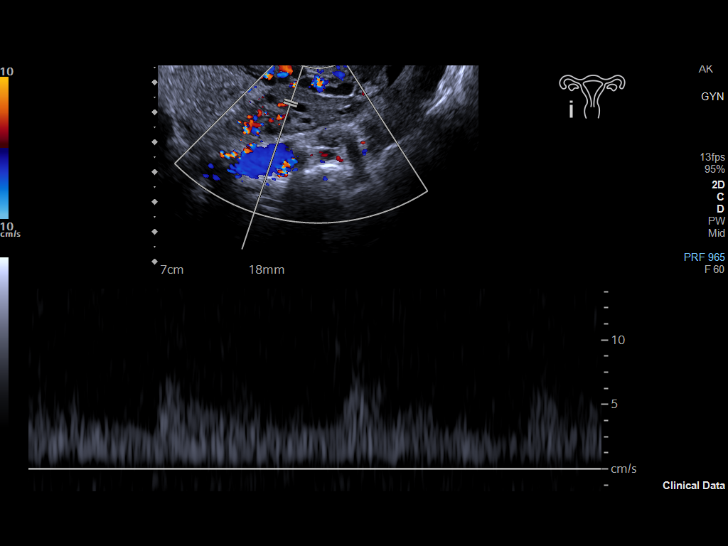
[im 87/116]
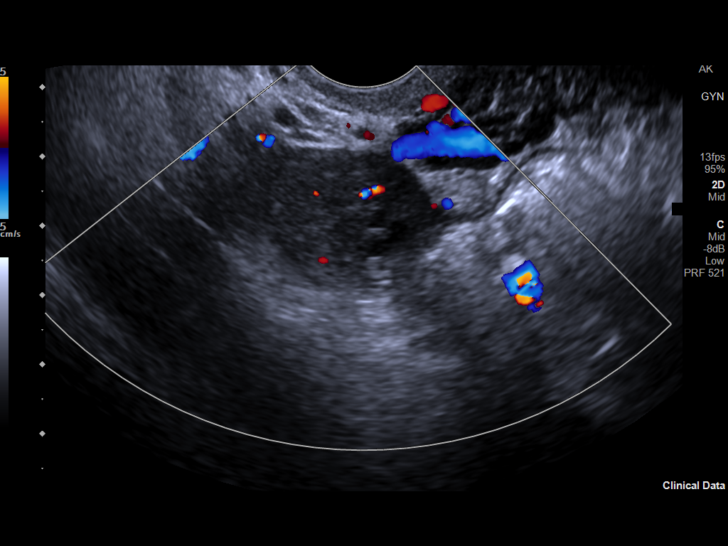
[im 96/116]
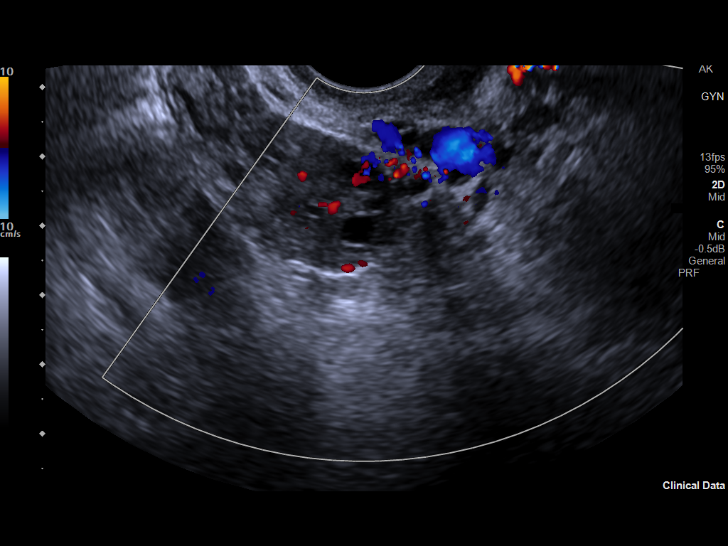
[im 106/116]
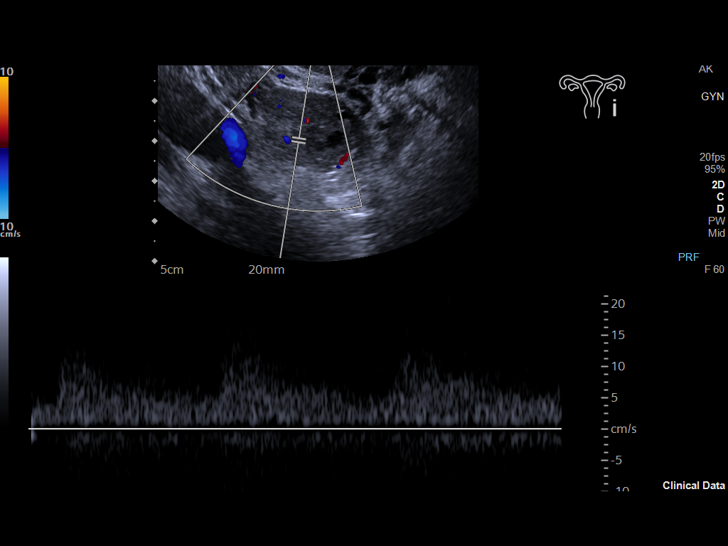
[im 116/116]
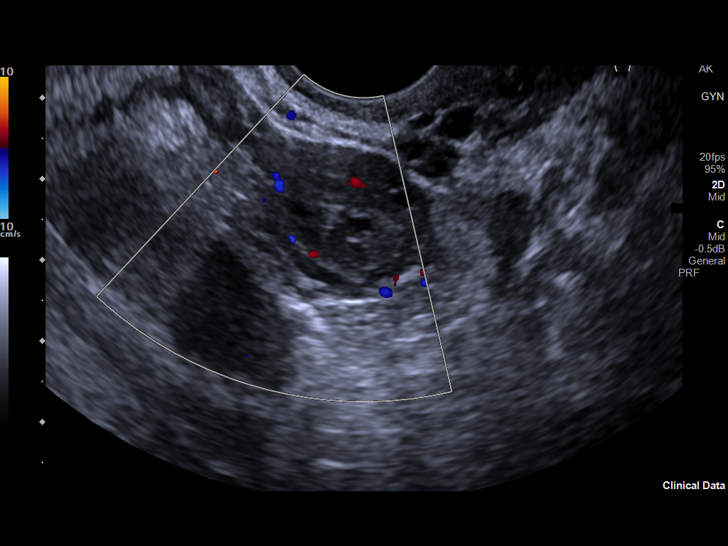

[13 of 25 positions shown; findings below may reference images not displayed]

FINDINGS: Uterus

Measurements: 6.7 x 2.9 x 4.3 cm = volume: 44 mL. No fibroids or
other mass visualized.

Endometrium

Thickness: 6 mm.  No focal abnormality visualized.

Right ovary

Measurements: 3.2 x 1.5 x 3.3 cm = volume: 8.2 mL. Normal
appearance/no adnexal mass.

Left ovary

Measurements: 2.5 x 2.0 x 2.3 cm = volume: 6.0 mL. Normal
appearance/no adnexal mass.

Pulsed Doppler evaluation of both ovaries demonstrates normal
low-resistance arterial and venous waveforms.

Other findings

Small volume simple appearing free fluid noted in the cul-de-sac.
IMPRESSION: 1. Small volume free fluid in the cul-de-sac. This may be
physiologic in a reproductive age female.
2. Otherwise unremarkable pelvic ultrasound.

## 2023-10-05 IMAGING — CT CT ABD-PELV W/ CM
2 of 4 series · 16 of 46 positions shown, 18 images · IV contrast (APPLIED)
Comparison: None Available.

CLINICAL DATA: Generalized abdominal pain vomiting and diarrhea for
1 week. Type 1 diabetic.

EXAM:
CT ABDOMEN AND PELVIS WITH CONTRAST
TECHNIQUE: Multidetector CT imaging of the abdomen and pelvis was performed
using the standard protocol following bolus administration of
intravenous contrast.

[Series 3: abdomen 5.0 · axial · 0.76mm/px · z∈[-1248,-853]mm · 13 of 89 slices shown, 15 images]
[im 5/89  soft-tissue]
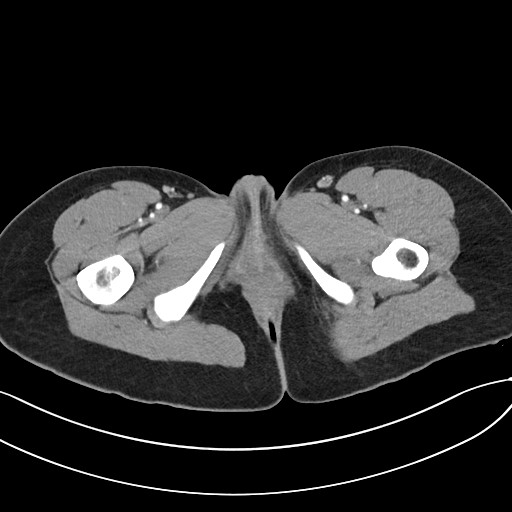
[im 5/89  bone]
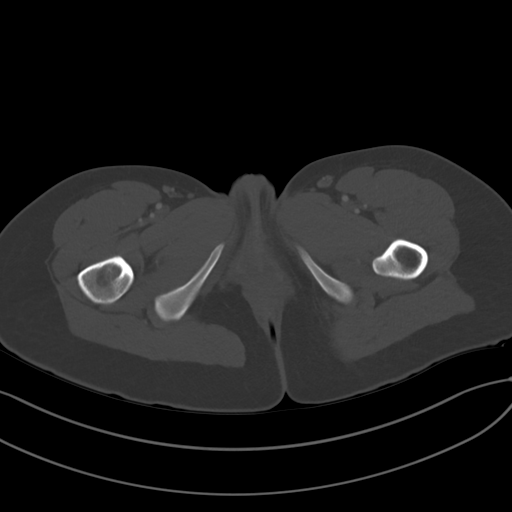
[im 14/89  soft-tissue]
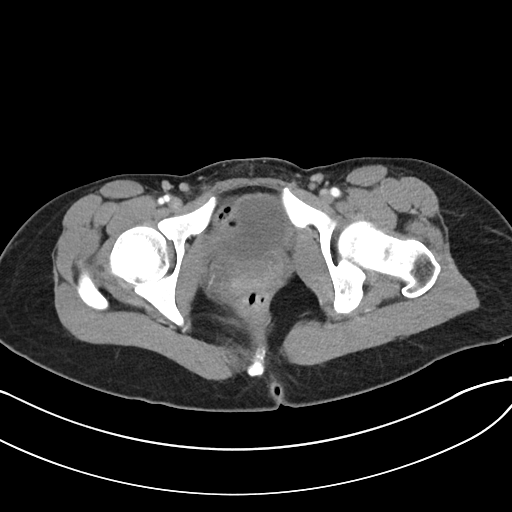
[im 19/89  soft-tissue]
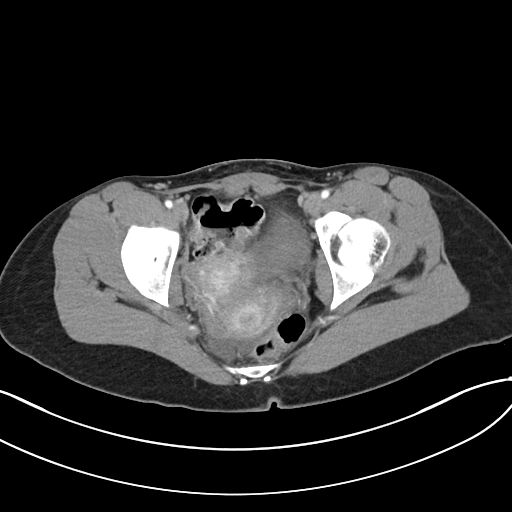
[im 24/89  soft-tissue]
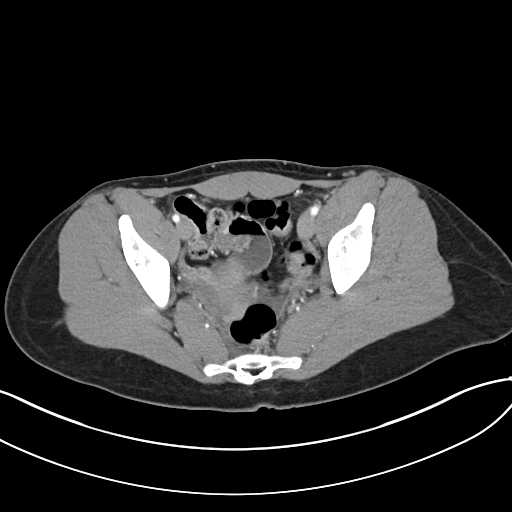
[im 33/89  soft-tissue]
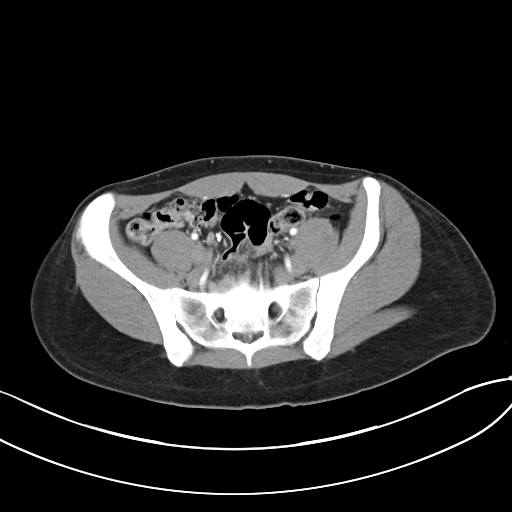
[im 38/89  soft-tissue]
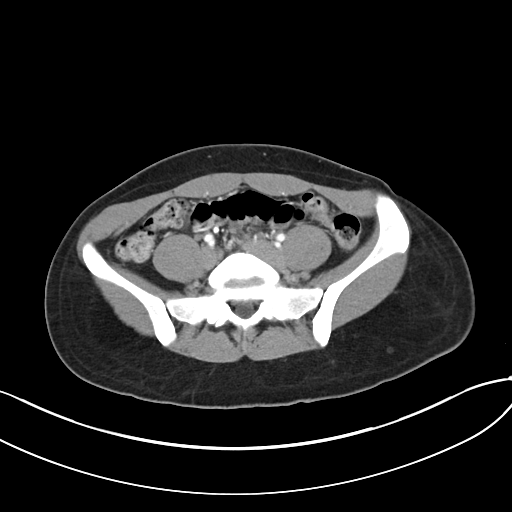
[im 47/89  soft-tissue]
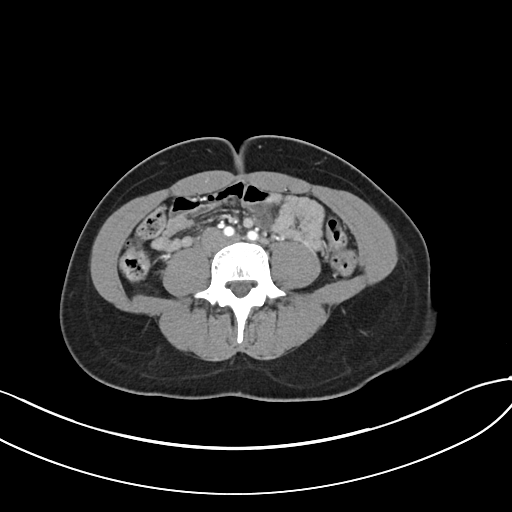
[im 51/89  soft-tissue]
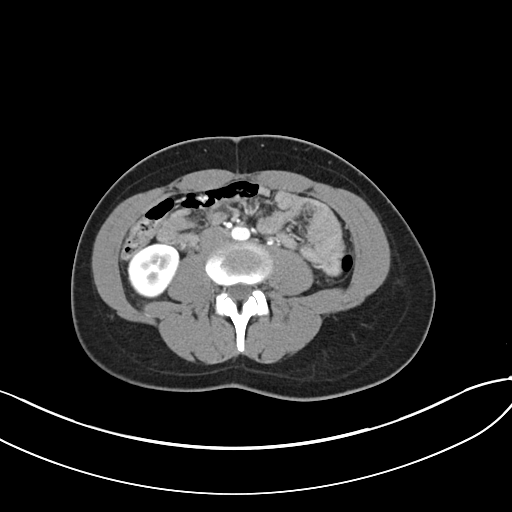
[im 56/89  soft-tissue]
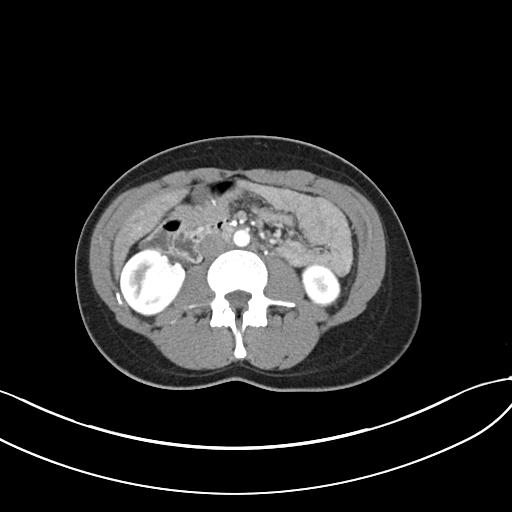
[im 56/89  bone]
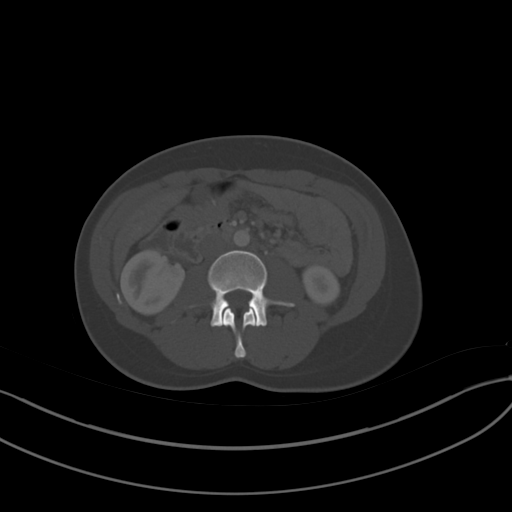
[im 65/89  soft-tissue]
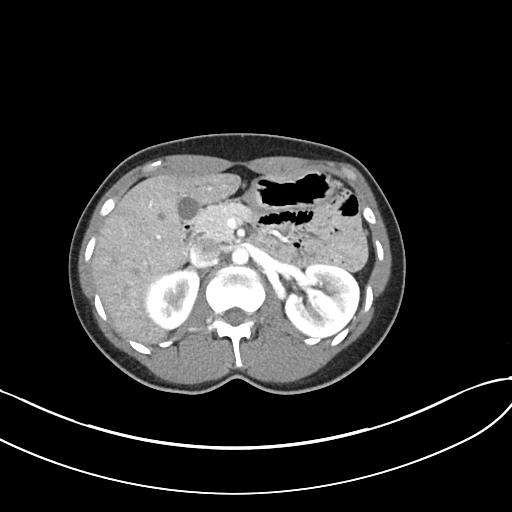
[im 70/89  soft-tissue]
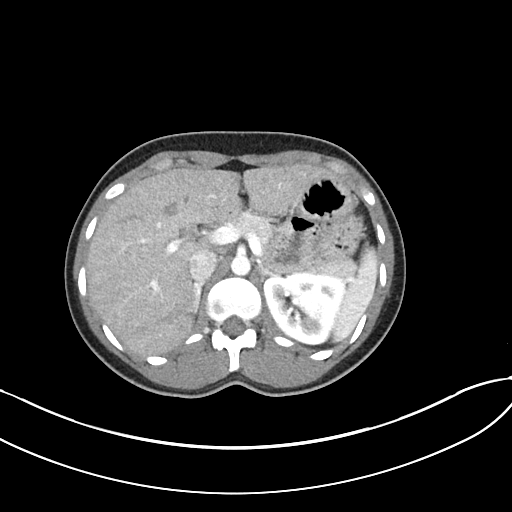
[im 75/89  soft-tissue]
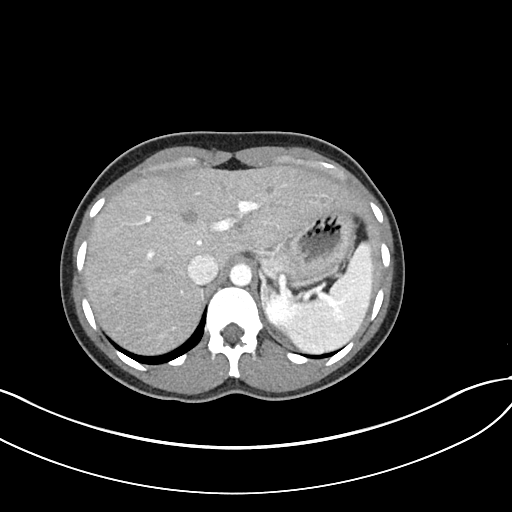
[im 84/89  soft-tissue]
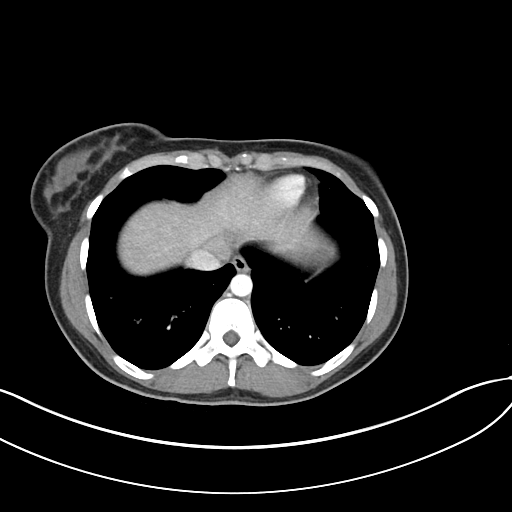

[Series 5: abdomen 3.0 mpr cor · coronal · 0.75mm/px · 3 of 70 slices shown]
[im 24/70  soft-tissue]
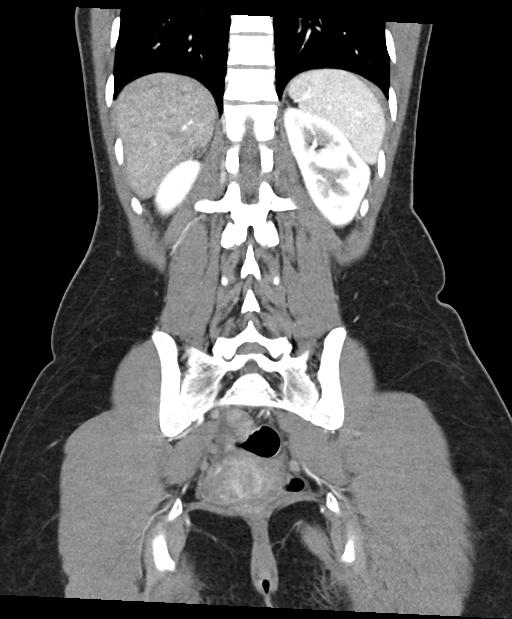
[im 31/70  soft-tissue]
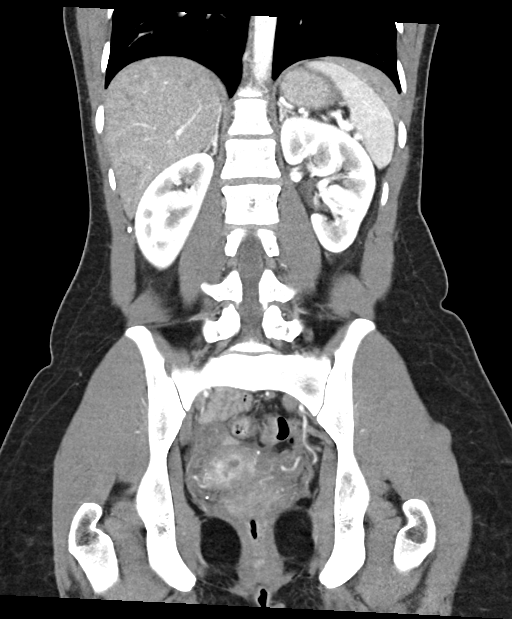
[im 39/70  soft-tissue]
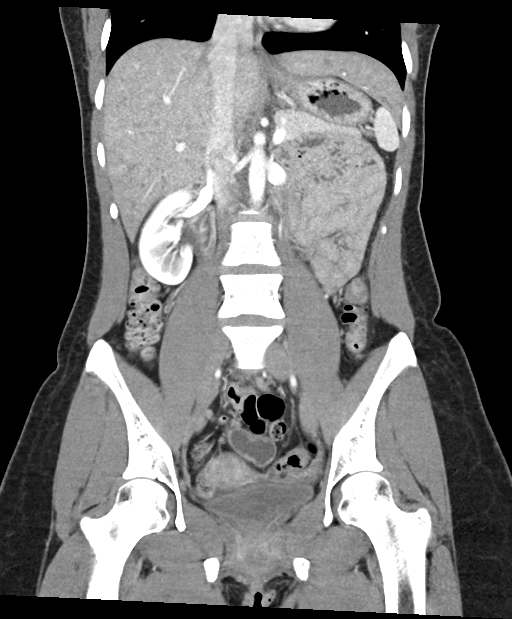

[16 of 46 positions shown; findings below may reference images not displayed]

RADIATION DOSE REDUCTION: This exam was performed according to the
departmental dose-optimization program which includes automated
exposure control, adjustment of the mA and/or kV according to
patient size and/or use of iterative reconstruction technique.

CONTRAST:  100mL OMNIPAQUE IOHEXOL 300 MG/ML  SOLN
FINDINGS: Lower chest: No acute abnormality.

Hepatobiliary: No focal liver abnormality is seen. No gallstones,
gallbladder wall thickening, or biliary dilatation.

Pancreas: Unremarkable. No pancreatic ductal dilatation or
surrounding inflammatory changes.

Spleen: Normal in size without focal abnormality.

Adrenals/Urinary Tract: Adrenal glands are unremarkable. Kidneys are
normal, without renal calculi, focal lesion, or hydronephrosis.
Bladder is unremarkable.

Stomach/Bowel: Stomach is within normal limits. Appendix appears
normal (series 5, image 30 1-33). No evidence of bowel wall
thickening, distention, or inflammatory changes.

Vascular/Lymphatic: No significant vascular findings are present. No
enlarged abdominal or pelvic lymph nodes.

Reproductive: Uterus and bilateral adnexa are unremarkable.

Other: No abdominal wall hernia or abnormality. No abdominopelvic
ascites.

Musculoskeletal: No acute or significant osseous findings.
IMPRESSION: 1. Bowel loops are normal in caliber. No evidence of colitis or
diverticulitis. Normal appendix.

2.  No evidence of nephrolithiasis or hydronephrosis.

3.  No CT evidence of acute abdominal/pelvic process.
